# Patient Record
Sex: Female | Born: 1950
Health system: Southern US, Community
[De-identification: ages and names within clinical notes are randomized; demographics above are authoritative.]

## PROBLEM LIST (undated history)

## (undated) DIAGNOSIS — Z8489 Family history of other specified conditions: Secondary | ICD-10-CM

## (undated) HISTORY — PX: APPENDECTOMY: SHX54

---

## 1974-01-12 DIAGNOSIS — Z8489 Family history of other specified conditions: Secondary | ICD-10-CM

## 1974-01-12 HISTORY — DX: Family history of other specified conditions: Z84.89

## 2012-10-21 ENCOUNTER — Encounter: Payer: Self-pay | Admitting: Obstetrics and Gynecology

## 2012-10-21 ENCOUNTER — Ambulatory Visit (INDEPENDENT_AMBULATORY_CARE_PROVIDER_SITE_OTHER): Payer: PRIVATE HEALTH INSURANCE | Admitting: Obstetrics and Gynecology

## 2012-10-21 VITALS — BP 100/60 | HR 80 | Ht 67.5 in | Wt 177.0 lb

## 2012-10-21 DIAGNOSIS — E559 Vitamin D deficiency, unspecified: Secondary | ICD-10-CM

## 2012-10-21 DIAGNOSIS — Z0000 Encounter for general adult medical examination without abnormal findings: Secondary | ICD-10-CM

## 2012-10-21 DIAGNOSIS — Z01419 Encounter for gynecological examination (general) (routine) without abnormal findings: Secondary | ICD-10-CM

## 2012-10-21 LAB — CBC
HCT: 41.1 % (ref 36.0–46.0)
Hemoglobin: 14.2 g/dL (ref 12.0–15.0)
MCHC: 34.5 g/dL (ref 30.0–36.0)
Platelets: 278 10*3/uL (ref 150–400)
RDW: 12.8 % (ref 11.5–15.5)
WBC: 11.4 10*3/uL — ABNORMAL HIGH (ref 4.0–10.5)

## 2012-10-21 LAB — POCT URINALYSIS DIPSTICK
Bilirubin, UA: NEGATIVE
Leukocytes, UA: NEGATIVE
Protein, UA: 5
pH, UA: 5

## 2012-10-21 NOTE — Patient Instructions (Signed)

## 2012-10-21 NOTE — Progress Notes (Signed)
Patient ID: Casey Villarreal, female   DOB: 09-23-1950, 62 y.o.   MRN: [id number] GYNECOLOGY VISIT  PCP:  Joyce Copa, MD [place], [place])  Referring provider:   HPI: 62 y.o.   Married  Caucasian  female   G2P2002 with Patient's last menstrual period was 01/12/2001.   here for   AEX. Never did any hormone therapy.  Hgb:   Urine:  Neg  GYNECOLOGIC HISTORY: Patient's last menstrual period was 01/12/2001. Sexually active:  yes Partner preference: female Contraception:   postmenopausal Menopausal hormone therapy: no DES exposure:  no  Blood transfusions:  no  Sexually transmitted diseases:   no GYN Procedures:  none Mammogram:   2011 WUJ:[id number], [place]              Pap: 2011 wnl  History of abnormal pap smear:  no   OB History   Grav Para Term Preterm Abortions TAB SAB Ect Mult Living   2 2 2       2        LIFESTYLE: Exercise:    no         Tobacco:    no Alcohol:       2 glasses of wine per day Drug use:    no  OTHER HEALTH MAINTENANCE: Tetanus/TDap:   Unsure ?5 years ago Gardisil:  NA Influenza:   09/2011 Zostavax:   2011  Bone density:  2012 ? Osteopenia:[place], [place] Colonoscopy:  2005 [id number], --next due 2015  Cholesterol check:  2013 borderline with PCP  Family History  Problem Relation Age of Onset  . Stroke Mother   . Breast cancer Maternal Aunt   . Ovarian cancer Paternal Grandmother     There are no active problems to display for this patient.  History reviewed. No pertinent past medical history.  Past Surgical History  Procedure Laterality Date  . Cesarean section  1986, 1989  . Appendectomy      ALLERGIES: Review of patient's allergies indicates no known allergies.  No current outpatient prescriptions on file.   No current facility-administered medications for this visit.     ROS:  Pertinent items are noted in HPI.  SOCIAL HISTORY:  Sales rep.  PHYSICAL EXAMINATION:    BP 100/60  Pulse 80  Ht 5' 7.5" (1.715 m)  Wt 177 lb  (80.287 kg)  BMI 27.3 kg/m2  LMP 01/12/2001   Wt Readings from Last 3 Encounters:  10/21/12 177 lb (80.287 kg)     Ht Readings from Last 3 Encounters:  10/21/12 5' 7.5" (1.715 m)    General appearance: alert, cooperative and appears stated age Head: Normocephalic, without obvious abnormality, atraumatic Neck: no adenopathy, supple, symmetrical, trachea midline and thyroid not enlarged, symmetric, no tenderness/mass/nodules Lungs: clear to auscultation bilaterally Breasts: Inspection negative, No nipple retraction or dimpling, No nipple discharge or bleeding, No axillary or supraclavicular adenopathy, Normal to palpation without dominant masses Heart: regular rate and rhythm Abdomen: soft, non-tender; no masses,  no organomegaly Extremities: extremities normal, atraumatic, no cyanosis or edema Skin: Skin color, texture, turgor normal. No rashes or lesions Lymph nodes: Cervical, supraclavicular, and axillary nodes normal. No abnormal inguinal nodes palpated Neurologic: Grossly normal  Pelvic: External genitalia:  no lesions              Urethra:  normal appearing urethra with no masses, tenderness or lesions              Bartholins and Skenes: normal  Vagina: normal appearing vagina with normal color and discharge, no lesions              Cervix: normal appearance              Pap and high risk HPV testing done: yes.            Bimanual Exam:  Uterus:  uterus is normal size, shape, consistency and nontender                                      Adnexa: normal adnexa in size, nontender and no masses                                      Rectovaginal: Confirms                                      Anus:  normal sphincter tone, no lesions  ASSESSMENT  Normal gynecologic exam. Osteopenia Vit D deficiency  PLAN  [facility] order [facility]. Pap smear and high risk HPV testing Comprehensive metabolic panel, TSH, lipid profile, CBC, vit  D. Return annually or prn   An After Visit Summary was printed and given to the patient.

## 2012-10-22 LAB — COMPREHENSIVE METABOLIC PANEL
ALT: 10 U/L (ref 0–35)
AST: 13 U/L (ref 0–37)
Albumin: 4.2 g/dL (ref 3.5–5.2)
Alkaline Phosphatase: 59 U/L (ref 39–117)
BUN: 17 mg/dL (ref 6–23)
Chloride: 105 mEq/L (ref 96–112)
Glucose, Bld: 82 mg/dL (ref 70–99)
Potassium: 4.1 mEq/L (ref 3.5–5.3)
Sodium: 139 mEq/L (ref 135–145)
Total Protein: 6.9 g/dL (ref 6.0–8.3)

## 2012-10-22 LAB — LIPID PANEL
HDL: 76 mg/dL (ref >39)
LDL Cholesterol: 119 mg/dL — ABNORMAL HIGH (ref 0–99)
Total CHOL/HDL Ratio: 2.7 Ratio
VLDL: 9 mg/dL (ref 0–40)

## 2012-10-22 LAB — VITAMIN D 25 HYDROXY (VIT D DEFICIENCY, FRACTURES): Vit D, 25-Hydroxy: 51 ng/mL (ref 30–89)

## 2012-10-22 LAB — TSH: TSH: 1.302 u[IU]/mL (ref 0.350–4.500)

## 2012-10-24 ENCOUNTER — Encounter: Payer: Self-pay | Admitting: Obstetrics and Gynecology

## 2012-10-24 ENCOUNTER — Other Ambulatory Visit: Payer: Self-pay | Admitting: Obstetrics and Gynecology

## 2012-10-24 DIAGNOSIS — M899 Disorder of bone, unspecified: Secondary | ICD-10-CM

## 2012-10-24 DIAGNOSIS — E559 Vitamin D deficiency, unspecified: Secondary | ICD-10-CM

## 2012-11-17 ENCOUNTER — Other Ambulatory Visit: Payer: Self-pay

## 2012-12-23 ENCOUNTER — Ambulatory Visit
Admission: RE | Admit: 2012-12-23 | Discharge: 2012-12-23 | Disposition: A | Discharge status: Home / Self Care | Payer: PRIVATE HEALTH INSURANCE | Source: Ambulatory Visit | Attending: Obstetrics and Gynecology | Admitting: Obstetrics and Gynecology

## 2012-12-23 DIAGNOSIS — Z01419 Encounter for gynecological examination (general) (routine) without abnormal findings: Secondary | ICD-10-CM

## 2012-12-23 DIAGNOSIS — M899 Disorder of bone, unspecified: Secondary | ICD-10-CM

## 2012-12-23 DIAGNOSIS — E559 Vitamin D deficiency, unspecified: Secondary | ICD-10-CM

## 2013-01-24 ENCOUNTER — Telehealth: Payer: Self-pay | Admitting: *Deleted

## 2013-01-24 NOTE — Telephone Encounter (Signed)
Follow-up call to patient regarding Dr Elza Rafter recommendation for consult to discuss BMD results.  Patient states she has been very busy and has not yet had a chance to get previous reports from test done in [place].  She states she is really opposed to beginning any sort of medication.  Advised that Dr Quincy Simmonds is okay with that but wants to make sure she has all the necessary information to make an informed choice regarding her options. She will get records from [place] and call back to schedule. Advised to ask for sally if she has difficulty obtaining records or getting consult appointment.   Routing to provider for final review. Patient agreeable to disposition. Will close encounter

## 2013-09-11 ENCOUNTER — Encounter: Payer: Self-pay | Admitting: Obstetrics and Gynecology

## 2013-10-23 ENCOUNTER — Ambulatory Visit: Payer: PRIVATE HEALTH INSURANCE | Admitting: Obstetrics and Gynecology

## 2013-11-02 ENCOUNTER — Ambulatory Visit: Payer: PRIVATE HEALTH INSURANCE | Admitting: Obstetrics and Gynecology

## 2013-11-08 ENCOUNTER — Ambulatory Visit (INDEPENDENT_AMBULATORY_CARE_PROVIDER_SITE_OTHER): Payer: PRIVATE HEALTH INSURANCE | Admitting: Obstetrics and Gynecology

## 2013-11-08 ENCOUNTER — Encounter: Payer: Self-pay | Admitting: Obstetrics and Gynecology

## 2013-11-08 VITALS — BP 122/86 | HR 72 | Ht 67.5 in | Wt 167.0 lb

## 2013-11-08 DIAGNOSIS — Z01419 Encounter for gynecological examination (general) (routine) without abnormal findings: Secondary | ICD-10-CM

## 2013-11-08 DIAGNOSIS — Z0000 Encounter for general adult medical examination without abnormal findings: Secondary | ICD-10-CM

## 2013-11-08 LAB — POCT URINALYSIS DIPSTICK
BILIRUBIN UA: NEGATIVE
Blood, UA: NEGATIVE
Glucose, UA: NEGATIVE
KETONES UA: NEGATIVE
LEUKOCYTES UA: NEGATIVE
Nitrite, UA: NEGATIVE
PH UA: 5
Protein, UA: NEGATIVE
Urobilinogen, UA: NEGATIVE

## 2013-11-08 LAB — CBC
HEMATOCRIT: 39.8 % (ref 36.0–46.0)
Hemoglobin: 13.2 g/dL (ref 12.0–15.0)
MCH: 30.6 pg (ref 26.0–34.0)
MCHC: 33.2 g/dL (ref 30.0–36.0)
MCV: 92.1 fL (ref 78.0–100.0)
PLATELETS: 278 10*3/uL (ref 150–400)
RBC: 4.32 MIL/uL (ref 3.87–5.11)
RDW: 13.1 % (ref 11.5–15.5)
WBC: 4.4 10*3/uL (ref 4.0–10.5)

## 2013-11-08 LAB — HEMOGLOBIN, FINGERSTICK: Hemoglobin, fingerstick: 13.4 g/dL (ref 12.0–16.0)

## 2013-11-08 NOTE — Progress Notes (Signed)
63 y.o. [id number] MarriedCaucasianF here for annual exam.    Had a left ankle fracture when she was in her 85s.  Slipped on the grass.   Moving to [facility].  Needs to sell house.   Patient's last menstrual period was 01/12/2001.          Sexually active: Yes.    The current method of family planning is post menopausal status.    Exercising: Yes.    Home exercise routine includes walking 5 miles a day. Smoker:  no  Health Maintenance: Pap:  10/21/12 Neg HR HPV History of abnormal Pap:  no MMG:  01/06/13 Bi-Rads Neg Colonoscopy:  2005 wnl: [place], [place]--next due 2015.  Will go to [facility]. Is getting new insurance December 2015. BMD:   12/23/12  , osteopenia.  Hip - 0.8, spine - 1.2. Frax model indicating increased risk of fracture.  Declines Rx.  TDaP:  Unsure?  Thinks it is over 10 years.  Declines this today.  Screening Labs: today, pt request fasting labs  Hb today: 13.4, Urine today:  Neg, ph: 5.0   reports that she has never smoked. She does not have any smokeless tobacco history on file. She reports that she drinks about 7 ounces of alcohol per week. She reports that she does not use illicit drugs.  History reviewed. No pertinent past medical history.  Past Surgical History  Procedure Laterality Date  . Cesarean section  1986, 1989  . Appendectomy      No current outpatient prescriptions on file.   No current facility-administered medications for this visit.    Family History  Problem Relation Age of Onset  . Stroke Mother   . Breast cancer Maternal Aunt   . Ovarian cancer Paternal Grandmother     ROS:  Pertinent items are noted in HPI.  Otherwise, a comprehensive ROS was negative.  Exam:   BP 122/86  Pulse 72  Ht 5' 7.5" (1.715 m)  Wt 167 lb (75.751 kg)  BMI 25.75 kg/m2  LMP 01/12/2001    Height: 5' 7.5" (171.5 cm)  Ht Readings from Last 3 Encounters:  11/08/13 5' 7.5" (1.715 m)  10/21/12 5' 7.5" (1.715 m)    General appearance: alert,  cooperative and appears stated age Head: Normocephalic, without obvious abnormality, atraumatic Neck: no adenopathy, supple, symmetrical, trachea midline and thyroid normal to inspection and palpation Lungs: clear to auscultation bilaterally Breasts: normal appearance, no masses or tenderness, Inspection negative, No nipple retraction or dimpling, No nipple discharge or bleeding, No axillary or supraclavicular adenopathy Heart: regular rate and rhythm Abdomen: soft, non-tender; bowel sounds normal; no masses,  no organomegaly Extremities: extremities normal, atraumatic, no cyanosis or edema Skin: Skin color, texture, turgor normal. No rashes or lesions Lymph nodes: Cervical, supraclavicular, and axillary nodes normal. No abnormal inguinal nodes palpated Neurologic: Grossly normal   Pelvic: External genitalia:  no lesions              Urethra:  normal appearing urethra with no masses, tenderness or lesions              Bartholins and Skenes: normal                 Vagina: normal appearing vagina with normal color and discharge, no lesions              Cervix: no lesions              Pap taken: No. Bimanual Exam:  Uterus:  normal size, contour, position, consistency, mobility, non-tender              Adnexa: normal adnexa and no mass, fullness, tenderness               Rectovaginal: Confirms               Anus:  normal sphincter tone, no lesions  A:  Well Woman with normal exam Osteopenia.   P:   Mammogram in December.  pap smear not indicated.  Cholesterol, CMP, CBC. Weight bearing exercise, normal calcium and vit D in diet, recheck BMD in 2 years.  Declines TDap.  return annually or prn  An After Visit Summary was printed and given to the patient.

## 2013-11-08 NOTE — Patient Instructions (Signed)

## 2013-11-09 LAB — LIPID PANEL
Cholesterol: 232 mg/dL — ABNORMAL HIGH (ref 0–200)
HDL: 82 mg/dL (ref >39)
LDL Cholesterol: 141 mg/dL — ABNORMAL HIGH (ref 0–99)
Total CHOL/HDL Ratio: 2.8 ratio
Triglycerides: 47 mg/dL (ref <150)
VLDL: 9 mg/dL (ref 0–40)

## 2013-11-09 LAB — COMPREHENSIVE METABOLIC PANEL
ALK PHOS: 71 U/L (ref 39–117)
ALT: 11 U/L (ref 0–35)
AST: 15 U/L (ref 0–37)
Albumin: 4.2 g/dL (ref 3.5–5.2)
BUN: 17 mg/dL (ref 6–23)
CO2: 23 mEq/L (ref 19–32)
CREATININE: 0.64 mg/dL (ref 0.50–1.10)
Calcium: 8.6 mg/dL (ref 8.4–10.5)
Chloride: 104 mEq/L (ref 96–112)
Glucose, Bld: 93 mg/dL (ref 70–99)
POTASSIUM: 4.1 meq/L (ref 3.5–5.3)
Sodium: 139 mEq/L (ref 135–145)
Total Bilirubin: 0.7 mg/dL (ref 0.2–1.2)
Total Protein: 6.8 g/dL (ref 6.0–8.3)

## 2013-11-13 ENCOUNTER — Encounter: Payer: Self-pay | Admitting: Obstetrics and Gynecology

## 2014-02-02 ENCOUNTER — Ambulatory Visit: Payer: PRIVATE HEALTH INSURANCE | Admitting: Obstetrics and Gynecology

## 2014-03-20 ENCOUNTER — Other Ambulatory Visit: Payer: Self-pay

## 2014-03-20 DIAGNOSIS — Z1231 Encounter for screening mammogram for malignant neoplasm of breast: Secondary | ICD-10-CM

## 2014-04-04 ENCOUNTER — Ambulatory Visit
Admission: RE | Admit: 2014-04-04 | Discharge: 2014-04-04 | Disposition: A | Discharge status: Home / Self Care | Payer: BLUE CROSS/BLUE SHIELD | Source: Ambulatory Visit

## 2014-04-04 DIAGNOSIS — Z1231 Encounter for screening mammogram for malignant neoplasm of breast: Secondary | ICD-10-CM

## 2014-11-12 ENCOUNTER — Ambulatory Visit (INDEPENDENT_AMBULATORY_CARE_PROVIDER_SITE_OTHER): Payer: BLUE CROSS/BLUE SHIELD | Admitting: Obstetrics and Gynecology

## 2014-11-12 ENCOUNTER — Encounter: Payer: Self-pay | Admitting: Obstetrics and Gynecology

## 2014-11-12 VITALS — BP 108/76 | HR 60 | Resp 14 | Ht 68.0 in | Wt 155.0 lb

## 2014-11-12 DIAGNOSIS — Z1211 Encounter for screening for malignant neoplasm of colon: Secondary | ICD-10-CM

## 2014-11-12 DIAGNOSIS — Z0000 Encounter for general adult medical examination without abnormal findings: Secondary | ICD-10-CM | POA: Diagnosis not present

## 2014-11-12 DIAGNOSIS — Z01419 Encounter for gynecological examination (general) (routine) without abnormal findings: Secondary | ICD-10-CM | POA: Diagnosis not present

## 2014-11-12 LAB — POCT URINALYSIS DIPSTICK
BILIRUBIN UA: NEGATIVE
GLUCOSE UA: NEGATIVE
LEUKOCYTES UA: NEGATIVE
NITRITE UA: NEGATIVE
Protein, UA: NEGATIVE
RBC UA: 5
UROBILINOGEN UA: NEGATIVE

## 2014-11-12 LAB — COMPREHENSIVE METABOLIC PANEL
ALT: 11 U/L (ref 6–29)
AST: 15 U/L (ref 10–35)
Albumin: 4.3 g/dL (ref 3.6–5.1)
Alkaline Phosphatase: 61 U/L (ref 33–130)
BUN: 14 mg/dL (ref 7–25)
CALCIUM: 9.2 mg/dL (ref 8.6–10.4)
CHLORIDE: 102 mmol/L (ref 98–110)
CO2: 29 mmol/L (ref 20–31)
CREATININE: 0.62 mg/dL (ref 0.50–0.99)
GLUCOSE: 76 mg/dL (ref 65–99)
POTASSIUM: 4.3 mmol/L (ref 3.5–5.3)
Sodium: 139 mmol/L (ref 135–146)
Total Bilirubin: 0.6 mg/dL (ref 0.2–1.2)
Total Protein: 7 g/dL (ref 6.1–8.1)

## 2014-11-12 LAB — LIPID PANEL
CHOLESTEROL: 230 mg/dL — AB (ref 125–200)
HDL: 79 mg/dL (ref >=46)
LDL Cholesterol: 143 mg/dL — ABNORMAL HIGH (ref <130)
TRIGLYCERIDES: 39 mg/dL (ref <150)
Total CHOL/HDL Ratio: 2.9 Ratio (ref <=5.0)
VLDL: 8 mg/dL (ref <30)

## 2014-11-12 LAB — CBC
HEMATOCRIT: 41.2 % (ref 36.0–46.0)
Hemoglobin: 14 g/dL (ref 12.0–15.0)
MCH: 30.3 pg (ref 26.0–34.0)
MCHC: 34 g/dL (ref 30.0–36.0)
MCV: 89.2 fL (ref 78.0–100.0)
MPV: 9.8 fL (ref 8.6–12.4)
Platelets: 349 10*3/uL (ref 150–400)
RBC: 4.62 MIL/uL (ref 3.87–5.11)
RDW: 12.8 % (ref 11.5–15.5)
WBC: 4.2 10*3/uL (ref 4.0–10.5)

## 2014-11-12 LAB — HEMOGLOBIN, FINGERSTICK: HEMOGLOBIN, FINGERSTICK: 13.8 g/dL (ref 12.0–16.0)

## 2014-11-12 NOTE — Patient Instructions (Signed)

## 2014-11-12 NOTE — Progress Notes (Signed)
64 y.o. G59P2002 Married Caucasian female here for annual exam.    Moving to [place] once they sell their home in [place].  Has some increased chin hair growth.  Plucks to remove.  No other hair growth.   PCP:   [place]   Patient's last menstrual period was 01/12/2001.          Sexually active: Yes.    The current method of family planning is post menopausal status.    Exercising: Yes.    walking 5 miles a day Smoker:  no  Health Maintenance: Pap:  10/21/12 Neg. HR HPV:neg History of abnormal Pap:  no MMG:  04/04/14 BIRADS1:neg Colonoscopy: 2005 normal in [place] - repeat 10 years. Pt knows overdue BMD:   12/2012   Result  Osteopenia  TDaP:  Due. Declines due to side effects.  Screening Labs:  Hb today: 13.8, Urine today: Ketones=trace   reports that she has never smoked. She has never used smokeless tobacco. She reports that she drinks about 7.0 oz of alcohol per week. She reports that she does not use illicit drugs.  History reviewed. No pertinent past medical history.  Past Surgical History  Procedure Laterality Date  . Cesarean section  1986, 1989  . Appendectomy      Current Outpatient Prescriptions  Medication Sig Dispense Refill  . Multiple Vitamin (MULTIVITAMIN) tablet Take 1 tablet by mouth daily.     No current facility-administered medications for this visit.    Family History  Problem Relation Age of Onset  . Stroke Mother   . Breast cancer Maternal Aunt   . Ovarian cancer Paternal Grandmother     ROS:  Pertinent items are noted in HPI.  Otherwise, a comprehensive ROS was negative.  Exam:   BP 108/76 mmHg  Pulse 60  Resp 14  Ht 5\' 8"  (1.727 m)  Wt 155 lb (70.308 kg)  BMI 23.57 kg/m2  LMP 01/12/2001    General appearance: alert, cooperative and appears stated age Head: Normocephalic, without obvious abnormality, atraumatic Neck: no adenopathy, supple, symmetrical, trachea midline and thyroid normal to inspection and palpation Lungs:  clear to auscultation bilaterally Breasts: normal appearance, no masses or tenderness, Inspection negative, No nipple retraction or dimpling, No nipple discharge or bleeding, No axillary or supraclavicular adenopathy Heart: regular rate and rhythm Abdomen: soft, non-tender; bowel sounds normal; no masses,  no organomegaly Extremities: extremities normal, atraumatic, no cyanosis or edema Skin: Skin color, texture, turgor normal. No rashes or lesions Lymph nodes: Cervical, supraclavicular, and axillary nodes normal. No abnormal inguinal nodes palpated Neurologic: Grossly normal  Pelvic: External genitalia:  no lesions              Urethra:  normal appearing urethra with no masses, tenderness or lesions              Bartholins and Skenes: normal                 Vagina: normal appearing vagina with normal color and discharge, no lesions              Cervix: no lesions              Pap taken: No. Bimanual Exam:  Uterus:  normal size, contour, position, consistency, mobility, non-tender              Adnexa: normal adnexa and no mass, fullness, tenderness              Rectovaginal: Yes.  .  Confirms.  Anus:  normal sphincter tone, no lesions  Chaperone was present for exam.  Assessment:   Well woman visit with normal exam. Osteopenia.   Plan: Yearly mammogram recommended after age 50.  Recommended self breast exam.  Pap and HR HPV as above. Discussed Calcium, Vitamin D, regular exercise program including cardiovascular and weight bearing exercise. Labs performed.  Yes.  .   See orders. Refills given on medications.  No..    Discussed electrolysis or laser hair removal through a dermatology office of choice.  Will refer to [place] for screening colonoscopy.  Bone density next year.  Follow up annually and prn.    After visit summary provided.

## 2015-06-12 ENCOUNTER — Other Ambulatory Visit: Payer: Self-pay

## 2015-06-12 DIAGNOSIS — Z1231 Encounter for screening mammogram for malignant neoplasm of breast: Secondary | ICD-10-CM

## 2015-06-26 ENCOUNTER — Ambulatory Visit
Admission: RE | Admit: 2015-06-26 | Discharge: 2015-06-26 | Disposition: A | Discharge status: Home / Self Care | Payer: BLUE CROSS/BLUE SHIELD | Source: Ambulatory Visit

## 2015-06-26 DIAGNOSIS — Z1231 Encounter for screening mammogram for malignant neoplasm of breast: Secondary | ICD-10-CM

## 2015-08-28 ENCOUNTER — Encounter: Payer: Self-pay | Admitting: Gastroenterology

## 2015-11-06 ENCOUNTER — Encounter: Payer: Self-pay | Admitting: Gastroenterology

## 2015-11-06 ENCOUNTER — Ambulatory Visit (INDEPENDENT_AMBULATORY_CARE_PROVIDER_SITE_OTHER): Payer: BLUE CROSS/BLUE SHIELD | Admitting: Gastroenterology

## 2015-11-06 VITALS — BP 112/72 | HR 74 | Ht 67.75 in | Wt 159.2 lb

## 2015-11-06 DIAGNOSIS — Z1211 Encounter for screening for malignant neoplasm of colon: Secondary | ICD-10-CM

## 2015-11-06 NOTE — Patient Instructions (Signed)
If you are age 63 or older, your body mass index should be between 23-30. Your Body mass index is 24.39 kg/m. If this is out of the aforementioned range listed, please consider follow up with your Primary Care Provider.  If you are age 58 or younger, your body mass index should be between 19-25. Your Body mass index is 24.39 kg/m. If this is out of the aformentioned range listed, please consider follow up with your Primary Care Provider.   We have sent your demographic and insurance information to [employer]. They should contact you within the next week regarding your Cologuard (colon cancer screening) test. If you have not heard from them within the next week, please call our office at [phone].

## 2015-11-06 NOTE — Progress Notes (Signed)
HPI :  65 y/o female with no significant medical history here to discuss colon cancer screening.  Her last colonoscopy was 11 years ago, done in [place]. No records available but she reports no polyps were removed, she was told to follow up in 10 years for further screening. She denies any trouble with her bowels, no blood in the stools. No diarrhea or constipation. No abdominal pains or weight loss. Aunt colon cancer, no other family members with colon cancer. She denies any history of anemia. She states she has seen Cologuard commercials on TV and wants to avoid a colonoscopy if possible.   No past medical history on file. Otherwise healthy  Past Surgical History:  Procedure Laterality Date  . APPENDECTOMY    . CESAREAN SECTION  1986, 1989     Family History  Problem Relation Age of Onset  . Stroke Mother   . Breast cancer Maternal Aunt   . Ovarian cancer Paternal Grandmother   mother leukemia  Social History  Substance Use Topics  . Smoking status: Never Smoker  . Smokeless tobacco: Never Used  . Alcohol use 7.0 oz/week    14 Standard drinks or equivalent per week     Comment: 2 glasses of wine per day   Current Outpatient Prescriptions  Medication Sig Dispense Refill  . Multiple Vitamin (MULTIVITAMIN) tablet Take 1 tablet by mouth daily.     No current facility-administered medications for this visit.    No Known Allergies   Review of Systems: All systems reviewed and negative except where noted in HPI.    Lab Results  Component Value Date   WBC 4.2 11/12/2014   HGB 13.8 11/12/2014   HGB 14.0 11/12/2014   HCT 41.2 11/12/2014   MCV 89.2 11/12/2014   PLT 349 11/12/2014    Lab Results  Component Value Date   CREATININE 0.62 11/12/2014   BUN 14 11/12/2014   NA 139 11/12/2014   K 4.3 11/12/2014   CL 102 11/12/2014   CO2 29 11/12/2014    Lab Results  Component Value Date   ALT 11 11/12/2014   AST 15 11/12/2014   ALKPHOS 61 11/12/2014   BILITOT  0.6 11/12/2014     Physical Exam: BP 112/72   Pulse 74   Ht 5' 7.75" (1.721 m)   Wt 159 lb 3.2 oz (72.2 kg)   LMP 01/12/2001   BMI 24.39 kg/m  Constitutional: Pleasant,well-developed, female in no acute distress. HEENT: Normocephalic and atraumatic. Conjunctivae are normal. No scleral icterus. Neck supple.  Cardiovascular: Normal rate, regular rhythm.  Pulmonary/chest: Effort normal and breath sounds normal. No wheezing, rales or rhonchi. Abdominal: Soft, nondistended, nontender. There are no masses palpable. No hepatomegaly. Extremities: no edema Lymphadenopathy: No cervical adenopathy noted. Neurological: Alert and oriented to person place and time. Skin: Skin is warm and dry. No rashes noted. Psychiatric: Normal mood and affect. Behavior is normal.   ASSESSMENT AND PLAN: 65 y/o healthy female as outlined above, at average risk for colon cancer, presenting for colon cancer screening. I discussed options for screening with her to include optical colonoscopy and stool testing, including risks / benefits of each, recommending optical colonoscopy at first line screening modality per triservice guidelines. Following our discussion she wanted to avoid a colonoscopy if at all possible. After discussion of stool testing, she wished to proceed with Cologuard over FIT testing, in hopes that if a negative test she will not need screening for 3 years. If it is positive, she  will need to proceed with optical colonoscopy. She agreed with the plan, will await results of Cologuard.  Williams Cellar, MD [facility] Pager [phone]

## 2015-11-07 ENCOUNTER — Telehealth: Payer: Self-pay

## 2015-11-08 NOTE — Telephone Encounter (Signed)
Received fax from [employer]. They requested husbands DOB since his is the Agricultural engineer. DOB entered on form and faxed back to [employer]

## 2015-11-13 NOTE — Progress Notes (Signed)
65 y.o. G62P2002 Married Caucasian female here for annual exam.    Trying to sell their home and move to [place].  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 Fasted today for labs.   PCP:  None   Patient's last menstrual period was 01/12/2001.           Sexually active: Yes.  female  The current method of family planning is post menopausal status.    Exercising: Yes.    Walks 30 miles/week Smoker:  no  Health Maintenance: Pap:  10-21-12 Neg:Neg HR HPV History of abnormal Pap:  no MMG:  06-26-15 Density B/Neg/BiRads1:[facility] Colonoscopy: ??2005 normal in [place] - repeat 10 years. Pt knows overdue  BMD:  12/2012  Result: [place] TDaP:  Declines--due to side effects Gardasil:   N/A HIV:  Declines. Hep C:  Today.  Screening Labs:  Urine today: Neg   reports that she has never smoked. She has never used smokeless tobacco. She reports that she drinks about 3.6 oz of alcohol per week . She reports that she does not use drugs.  History reviewed. No pertinent past medical history.  Past Surgical History:  Procedure Laterality Date  . APPENDECTOMY    . CESAREAN SECTION  1986, 1989    Current Outpatient Prescriptions  Medication Sig Dispense Refill  . Multiple Vitamin (MULTIVITAMIN) tablet Take 1 tablet by mouth daily.     No current facility-administered medications for this visit.     Family History  Problem Relation Age of Onset  . Stroke Mother   . Depression Brother   . Heart disease Brother   . Breast cancer Maternal Aunt   . Ovarian cancer Paternal Grandmother     ROS:  Pertinent items are noted in HPI.  Otherwise, a comprehensive ROS was negative.  Exam:   BP 110/64 (BP Location: Right Arm, Patient Position: Sitting, Cuff Size: Normal)   Pulse (!) 50   Resp 14   Ht 5' 7.5" (1.715 m)   Wt 154 lb (69.9 kg)   LMP 01/12/2001   BMI 23.76 kg/m     General appearance: alert, cooperative and appears stated age Head: Normocephalic, without  obvious abnormality, atraumatic Neck: no adenopathy, supple, symmetrical, trachea midline and thyroid normal to inspection and palpation Lungs: clear to auscultation bilaterally Breasts: normal appearance, no masses or tenderness, No nipple retraction or dimpling, No nipple discharge or bleeding, No axillary or supraclavicular adenopathy Heart: regular rate and rhythm Abdomen: soft, non-tender; no masses, no organomegaly Extremities: extremities normal, atraumatic, no cyanosis or edema Skin: Skin color, texture, turgor normal. No rashes or lesions Lymph nodes: Cervical, supraclavicular, and axillary nodes normal. No abnormal inguinal nodes palpated Neurologic: Grossly normal  Pelvic: External genitalia:  no lesions              Urethra:  normal appearing urethra with no masses, tenderness or lesions              Bartholins and Skenes: normal                 Vagina: normal appearing vagina with normal color and discharge, no lesions              Cervix: no lesions              Pap taken: Yes.   Bimanual Exam:  Uterus:  normal size, contour, position, consistency, mobility, non-tender              Adnexa:  no mass, fullness, tenderness              Rectal exam: Yes.  .  Confirms.              Anus:  normal sphincter tone, no lesions  Chaperone was present for exam.  Assessment:   Well woman visit with normal exam. Osteopenia.  Plan: Yearly mammogram recommended after age 22.  Recommended self breast exam.  Pap and HR HPV as above. Discussed Calcium, Vitamin D, regular exercise program including cardiovascular and weight bearing exercise. [facility]. She will call to schedule.  Routine labs and hep C. Follow up annually and prn.       After visit summary provided.

## 2015-11-15 ENCOUNTER — Ambulatory Visit (INDEPENDENT_AMBULATORY_CARE_PROVIDER_SITE_OTHER): Payer: BLUE CROSS/BLUE SHIELD | Admitting: Obstetrics and Gynecology

## 2015-11-15 ENCOUNTER — Encounter: Payer: Self-pay | Admitting: Obstetrics and Gynecology

## 2015-11-15 VITALS — BP 110/64 | HR 50 | Resp 14 | Ht 67.5 in | Wt 154.0 lb

## 2015-11-15 DIAGNOSIS — M8588 Other specified disorders of bone density and structure, other site: Secondary | ICD-10-CM | POA: Diagnosis not present

## 2015-11-15 DIAGNOSIS — D72819 Decreased white blood cell count, unspecified: Secondary | ICD-10-CM

## 2015-11-15 DIAGNOSIS — Z0000 Encounter for general adult medical examination without abnormal findings: Secondary | ICD-10-CM | POA: Diagnosis not present

## 2015-11-15 DIAGNOSIS — Z119 Encounter for screening for infectious and parasitic diseases, unspecified: Secondary | ICD-10-CM

## 2015-11-15 DIAGNOSIS — Z01419 Encounter for gynecological examination (general) (routine) without abnormal findings: Secondary | ICD-10-CM

## 2015-11-15 LAB — CBC
HCT: 40.3 % (ref 35.0–45.0)
Hemoglobin: 13.3 g/dL (ref 11.7–15.5)
MCH: 30.5 pg (ref 27.0–33.0)
MCHC: 33 g/dL (ref 32.0–36.0)
MCV: 92.4 fL (ref 80.0–100.0)
MPV: 9.6 fL (ref 7.5–12.5)
Platelets: 291 10*3/uL (ref 140–400)
RBC: 4.36 MIL/uL (ref 3.80–5.10)
RDW: 12.9 % (ref 11.0–15.0)
WBC: 3.6 10*3/uL — ABNORMAL LOW (ref 3.8–10.8)

## 2015-11-15 LAB — TSH: TSH: 1.89 m[IU]/L

## 2015-11-15 LAB — POCT URINALYSIS DIPSTICK
Bilirubin, UA: NEGATIVE
Blood, UA: NEGATIVE
GLUCOSE UA: NEGATIVE
Ketones, UA: NEGATIVE
Leukocytes, UA: NEGATIVE
Nitrite, UA: NEGATIVE
Protein, UA: NEGATIVE
Urobilinogen, UA: NEGATIVE
pH, UA: 5

## 2015-11-15 NOTE — Patient Instructions (Signed)

## 2015-11-16 LAB — COMPREHENSIVE METABOLIC PANEL
ALT: 11 U/L (ref 6–29)
AST: 18 U/L (ref 10–35)
Albumin: 4.1 g/dL (ref 3.6–5.1)
Alkaline Phosphatase: 55 U/L (ref 33–130)
BUN: 11 mg/dL (ref 7–25)
CHLORIDE: 103 mmol/L (ref 98–110)
CO2: 24 mmol/L (ref 20–31)
CREATININE: 0.72 mg/dL (ref 0.50–0.99)
Calcium: 9.3 mg/dL (ref 8.6–10.4)
Glucose, Bld: 80 mg/dL (ref 65–99)
Potassium: 4.5 mmol/L (ref 3.5–5.3)
SODIUM: 139 mmol/L (ref 135–146)
Total Bilirubin: 0.7 mg/dL (ref 0.2–1.2)
Total Protein: 7 g/dL (ref 6.1–8.1)

## 2015-11-16 LAB — LIPID PANEL
Cholesterol: 210 mg/dL — ABNORMAL HIGH (ref 125–200)
HDL: 85 mg/dL (ref >=46)
LDL Cholesterol: 116 mg/dL (ref <130)
Total CHOL/HDL Ratio: 2.5 Ratio (ref <=5.0)
Triglycerides: 46 mg/dL (ref <150)
VLDL: 9 mg/dL (ref <30)

## 2015-11-16 LAB — HEPATITIS C ANTIBODY: HCV Ab: NEGATIVE

## 2015-11-16 LAB — VITAMIN D 25 HYDROXY (VIT D DEFICIENCY, FRACTURES): VIT D 25 HYDROXY: 33 ng/mL (ref 30–100)

## 2015-11-17 ENCOUNTER — Encounter: Payer: Self-pay | Admitting: Obstetrics and Gynecology

## 2015-11-17 NOTE — Addendum Note (Signed)
Addended by: Yisroel Ramming, Dietrich Pates E on: 11/17/2015 07:33 PM   Modules accepted: Orders

## 2015-11-20 LAB — IPS PAP TEST WITH HPV

## 2015-11-25 ENCOUNTER — Other Ambulatory Visit: Payer: Self-pay

## 2015-11-25 LAB — COLOGUARD: COLOGUARD: NEGATIVE

## 2015-11-26 ENCOUNTER — Encounter: Payer: Self-pay | Admitting: Gastroenterology

## 2015-11-27 ENCOUNTER — Ambulatory Visit: Payer: BLUE CROSS/BLUE SHIELD | Admitting: Obstetrics and Gynecology

## 2015-11-27 NOTE — Progress Notes (Signed)
Letter mailed

## 2016-01-27 ENCOUNTER — Other Ambulatory Visit (INDEPENDENT_AMBULATORY_CARE_PROVIDER_SITE_OTHER): Payer: BLUE CROSS/BLUE SHIELD

## 2016-01-27 DIAGNOSIS — D72819 Decreased white blood cell count, unspecified: Secondary | ICD-10-CM

## 2016-01-28 LAB — CBC WITH DIFFERENTIAL/PLATELET
BASOS ABS: 120 {cells}/uL (ref 0–200)
BASOS PCT: 3 %
Eosinophils Absolute: 160 cells/uL (ref 15–500)
Eosinophils Relative: 4 %
HCT: 38.9 % (ref 35.0–45.0)
HEMOGLOBIN: 12.5 g/dL (ref 11.7–15.5)
LYMPHS ABS: 1160 {cells}/uL (ref 850–3900)
Lymphocytes Relative: 29 %
MCH: 30.7 pg (ref 27.0–33.0)
MCHC: 32.1 g/dL (ref 32.0–36.0)
MCV: 95.6 fL (ref 80.0–100.0)
MPV: 9.8 fL (ref 7.5–12.5)
Monocytes Absolute: 480 cells/uL (ref 200–950)
Monocytes Relative: 12 %
Neutro Abs: 2080 cells/uL (ref 1500–7800)
Neutrophils Relative %: 52 %
PLATELETS: 360 10*3/uL (ref 140–400)
RBC: 4.07 MIL/uL (ref 3.80–5.10)
RDW: 13.5 % (ref 11.0–15.0)
WBC: 4 10*3/uL (ref 3.8–10.8)

## 2016-11-05 ENCOUNTER — Other Ambulatory Visit: Payer: Self-pay | Admitting: Obstetrics and Gynecology

## 2016-11-05 DIAGNOSIS — Z1231 Encounter for screening mammogram for malignant neoplasm of breast: Secondary | ICD-10-CM

## 2016-11-16 NOTE — Progress Notes (Signed)
66 y.o. G36P2002 Married Caucasian female here for annual exam.    Sold their home and moved to [place].   Lost 15 pounds of weight.  [facility] [employer]   Wants to do routine labs today.  PCP: No PCP per patient  Patient's last menstrual period was 01/12/2001.           Sexually active: Yes.    The current method of family planning is post menopausal status.    Exercising: Yes.    cardio, weight bearing Smoker:  no  Health Maintenance: Pap: 11/15/15 Neg: Neg HR HPV  10-21-12 Neg:Neg HR HPV  History of abnormal Pap:  no MMG: 06-26-15 Density B/Neg/BiRads1:TBC Colonoscopy: patient did the Cologuard in 2018.  Negative. BMD: 12/2012  Result :Osteopenia:TBC TDaP:  Declines--due to side effects Gardasil:   no HIV: never Hep C:11-15-15 Neg Screening Labs:  Discuss today   reports that  has never smoked. she has never used smokeless tobacco. She reports that she drinks about 3.6 oz of alcohol per week. She reports that she does not use drugs.  History reviewed. No pertinent past medical history.  Past Surgical History:  Procedure Laterality Date  . APPENDECTOMY    . CESAREAN SECTION  1986, 1989    Current Outpatient Medications  Medication Sig Dispense Refill  . Multiple Vitamin (MULTIVITAMIN) tablet Take 1 tablet by mouth daily.     No current facility-administered medications for this visit.     Family History  Problem Relation Age of Onset  . Stroke Mother   . Depression Brother   . Heart disease Brother   . Breast cancer Maternal Aunt   . Ovarian cancer Paternal Grandmother     ROS:  Pertinent items are noted in HPI.  Otherwise, a comprehensive ROS was negative.  Exam:   BP 110/62 (BP Location: Right Arm, Patient Position: Sitting, Cuff Size: Normal)   Pulse 76   Resp 16   Ht 5\' 8"  (1.727 m)   Wt 144 lb (65.3 kg)   LMP 01/12/2001   BMI 21.90 kg/m     General appearance: alert, cooperative and appears stated age Head: Normocephalic, without obvious abnormality,  atraumatic Neck: no adenopathy, supple, symmetrical, trachea midline and thyroid normal to inspection and palpation Lungs: clear to auscultation bilaterally Breasts: normal appearance, no masses or tenderness, No nipple retraction or dimpling, No nipple discharge or bleeding, No axillary or supraclavicular adenopathy Heart: regular rate and rhythm Abdomen: soft, non-tender; no masses, no organomegaly Extremities: extremities normal, atraumatic, no cyanosis or edema Skin: Skin color, texture, turgor normal. No rashes or lesions Lymph nodes: Cervical, supraclavicular, and axillary nodes normal. No abnormal inguinal nodes palpated Neurologic: Grossly normal  Pelvic: External genitalia:  no lesions              Urethra:  normal appearing urethra with no masses, tenderness or lesions              Bartholins and Skenes: normal                 Vagina: normal appearing vagina with normal color and discharge, no lesions              Cervix: no lesions              Pap taken: No. Bimanual Exam:  Uterus:  normal size, contour, position, consistency, mobility, non-tender              Adnexa: no mass, fullness, tenderness  Rectal exam: Yes.  .  Confirms.              Anus:  normal sphincter tone, no lesions  Chaperone was present for exam.  Assessment:   Well woman visit with normal exam. Osteopenia.   Plan: Mammogram screening discussed.  Has appointment.  Recommended self breast awareness. Pap and HR HPV as above. Guidelines for Calcium, Vitamin D, regular exercise program including cardiovascular and weight bearing exercise. Routine labs - cholesterol, CMP, CBC, vit D, and TSH. BMD ordered and patient will call to schedule. She will establish care with PCP.  Follow up annually and prn.   After visit summary provided.

## 2016-11-18 ENCOUNTER — Ambulatory Visit: Payer: BLUE CROSS/BLUE SHIELD | Admitting: Obstetrics and Gynecology

## 2016-11-18 ENCOUNTER — Other Ambulatory Visit: Payer: Self-pay

## 2016-11-18 ENCOUNTER — Encounter: Payer: Self-pay | Admitting: Obstetrics and Gynecology

## 2016-11-18 VITALS — BP 110/62 | HR 76 | Resp 16 | Ht 68.0 in | Wt 144.0 lb

## 2016-11-18 DIAGNOSIS — Z01419 Encounter for gynecological examination (general) (routine) without abnormal findings: Secondary | ICD-10-CM | POA: Diagnosis not present

## 2016-11-18 DIAGNOSIS — M8580 Other specified disorders of bone density and structure, unspecified site: Secondary | ICD-10-CM

## 2016-11-18 DIAGNOSIS — Z780 Asymptomatic menopausal state: Secondary | ICD-10-CM | POA: Diagnosis not present

## 2016-11-18 NOTE — Patient Instructions (Signed)

## 2016-11-19 LAB — CBC
HEMATOCRIT: 42.2 % (ref 34.0–46.6)
Hemoglobin: 13.8 g/dL (ref 11.1–15.9)
MCH: 31 pg (ref 26.6–33.0)
MCHC: 32.7 g/dL (ref 31.5–35.7)
MCV: 95 fL (ref 79–97)
Platelets: 331 10*3/uL (ref 150–379)
RBC: 4.45 x10E6/uL (ref 3.77–5.28)
RDW: 13.2 % (ref 12.3–15.4)
WBC: 4.2 10*3/uL (ref 3.4–10.8)

## 2016-11-19 LAB — LIPID PANEL
Chol/HDL Ratio: 2.7 ratio (ref 0.0–4.4)
Cholesterol, Total: 227 mg/dL — ABNORMAL HIGH (ref 100–199)
HDL: 83 mg/dL (ref >39)
LDL Calculated: 136 mg/dL — ABNORMAL HIGH (ref 0–99)
TRIGLYCERIDES: 40 mg/dL (ref 0–149)
VLDL CHOLESTEROL CAL: 8 mg/dL (ref 5–40)

## 2016-11-19 LAB — COMPREHENSIVE METABOLIC PANEL
A/G RATIO: 1.8 (ref 1.2–2.2)
ALK PHOS: 64 IU/L (ref 39–117)
ALT: 13 IU/L (ref 0–32)
AST: 17 IU/L (ref 0–40)
Albumin: 4.4 g/dL (ref 3.6–4.8)
BUN / CREAT RATIO: 20 (ref 12–28)
BUN: 14 mg/dL (ref 8–27)
Bilirubin Total: 0.4 mg/dL (ref 0.0–1.2)
CHLORIDE: 103 mmol/L (ref 96–106)
CO2: 25 mmol/L (ref 20–29)
Calcium: 9.1 mg/dL (ref 8.7–10.3)
Creatinine, Ser: 0.71 mg/dL (ref 0.57–1.00)
GFR calc Af Amer: 104 mL/min/{1.73_m2} (ref >59)
GFR calc non Af Amer: 90 mL/min/{1.73_m2} (ref >59)
GLOBULIN, TOTAL: 2.5 g/dL (ref 1.5–4.5)
Glucose: 89 mg/dL (ref 65–99)
Potassium: 4.4 mmol/L (ref 3.5–5.2)
SODIUM: 143 mmol/L (ref 134–144)
Total Protein: 6.9 g/dL (ref 6.0–8.5)

## 2016-11-19 LAB — TSH: TSH: 1.96 u[IU]/mL (ref 0.450–4.500)

## 2016-11-19 LAB — VITAMIN D 25 HYDROXY (VIT D DEFICIENCY, FRACTURES): Vit D, 25-Hydroxy: 32.5 ng/mL (ref 30.0–100.0)

## 2016-12-07 ENCOUNTER — Ambulatory Visit
Admission: RE | Admit: 2016-12-07 | Discharge: 2016-12-07 | Disposition: A | Discharge status: Home / Self Care | Payer: BLUE CROSS/BLUE SHIELD | Source: Ambulatory Visit | Attending: Obstetrics and Gynecology | Admitting: Obstetrics and Gynecology

## 2016-12-07 DIAGNOSIS — Z1231 Encounter for screening mammogram for malignant neoplasm of breast: Secondary | ICD-10-CM

## 2016-12-23 ENCOUNTER — Telehealth: Payer: Self-pay

## 2016-12-23 ENCOUNTER — Ambulatory Visit
Admission: RE | Admit: 2016-12-23 | Discharge: 2016-12-23 | Disposition: A | Discharge status: Home / Self Care | Payer: BLUE CROSS/BLUE SHIELD | Source: Ambulatory Visit | Attending: Obstetrics and Gynecology | Admitting: Obstetrics and Gynecology

## 2016-12-23 ENCOUNTER — Encounter: Payer: Self-pay | Admitting: Obstetrics and Gynecology

## 2016-12-23 DIAGNOSIS — M8580 Other specified disorders of bone density and structure, unspecified site: Secondary | ICD-10-CM

## 2016-12-23 DIAGNOSIS — Z780 Asymptomatic menopausal state: Secondary | ICD-10-CM

## 2016-12-23 NOTE — Telephone Encounter (Signed)
Spoke with Opal Sidles at [facility] and she will have Dr.Green review BMD and either call us or send and addendum. Routed to [place]

## 2016-12-23 NOTE — Telephone Encounter (Signed)
-----   Message from Casey Dom, MD sent at 12/23/2016  1:52 PM EST ----- Please check with radiology. The patient's FRAX risk was 23% of any fracture several years ago and now it is 15%. She has not been on medication. Thanks.

## 2016-12-24 NOTE — Telephone Encounter (Signed)
Dr. Talbert Nan - please review edited BMD and advise on results.     Non-Urgent Medical Question  Message [id number]  From Emeline Gins To Nunzio Cobbs, MD Sent 12/23/2016 5:33 PM  Hi Dr. Quincy Simmonds,  I don't understand the results of my bone density. Could you please put it in a language that I can understand.  Thanks,  Casey Villarreal    Cc: Marisa Sprinkles

## 2016-12-24 NOTE — Telephone Encounter (Signed)
Please let the patient know that we have a call in to the Radiologist to clarify her results. Once we here from them we will be back in touch with her.

## 2016-12-28 NOTE — Telephone Encounter (Signed)
See result note.  

## 2017-03-17 ENCOUNTER — Telehealth: Payer: Self-pay | Admitting: Obstetrics and Gynecology

## 2017-03-17 NOTE — Telephone Encounter (Signed)
Patient was treated for a yeast infection by her PCP and still has symptoms. Patient said she took a 1 day pill then a 7 day treatment twice. Patient would like to talk with Dr.Silva's nurse.

## 2017-03-17 NOTE — Telephone Encounter (Signed)
Spoke with pateint. Reports yeast symptoms for 1 month, have improved, not resolved.   Self treated with OTC monistat.  Seen by PCP, treated with diflucan x1, fluconazole 100mg  x7 days and again,  fluconazole 100mg  x7 days, is on last dose.   Reports pelvic pressure and "wheaty" urine smell, denies d/c.   Recommended OV for further evaluation, OV scheduled for 3/7 at 9am with Dr. Quincy Simmonds.   Routing to provider for final review. Patient is agreeable to disposition. Will close encounter.

## 2017-03-18 ENCOUNTER — Ambulatory Visit: Payer: BLUE CROSS/BLUE SHIELD | Admitting: Obstetrics and Gynecology

## 2017-03-18 ENCOUNTER — Encounter: Payer: Self-pay | Admitting: Obstetrics and Gynecology

## 2017-03-18 VITALS — BP 122/76 | HR 60 | Resp 14 | Wt 152.0 lb

## 2017-03-18 DIAGNOSIS — N9489 Other specified conditions associated with female genital organs and menstrual cycle: Secondary | ICD-10-CM

## 2017-03-18 DIAGNOSIS — R102 Pelvic and perineal pain: Secondary | ICD-10-CM | POA: Diagnosis not present

## 2017-03-18 LAB — POCT URINALYSIS DIPSTICK
Bilirubin, UA: NEGATIVE
Glucose, UA: NEGATIVE
Ketones, UA: NEGATIVE
Nitrite, UA: NEGATIVE
PROTEIN UA: NEGATIVE
RBC UA: NEGATIVE
Urobilinogen, UA: 0.2 E.U./dL
pH, UA: 5 (ref 5.0–8.0)

## 2017-03-18 NOTE — Progress Notes (Signed)
GYNECOLOGY  VISIT   HPI: 67 y.o.   Married  Caucasian  female   G2P2002 with Patient's last menstrual period was 01/12/2001.   here for possible yeast infection -- patient complains of pressure in pelvic area.  Presumptive yeast infection 2 months ago. Started after not taking her leotards off quickly. No new exercise plan.  tx with Monistat. tx with Diflucan x 3 courses and symptoms persist.   No wet prep or testing was done.   Feels pressure in her pelvis and burning on the outside. Points to the left labia.  Notes odor that smells like bread.  No discharge.   No dysuria.  Some urgency.   No diarrhea or constipation.   Had a persistent YI in the past and it was tx with Vagitrol.  Taking Advil 200 mg daily for URI.   No partner change.   GYNECOLOGIC HISTORY: Patient's last menstrual period was 01/12/2001. Contraception:  Postmenopausal Menopausal hormone therapy:  none Last mammogram:  12/07/16 BIRADS 1 negative/density b Last pap smear:   11/15/15 Pap and HR HPV negative        OB History    Gravida Para Term Preterm AB Living   2 2 2     2    SAB TAB Ectopic Multiple Live Births                     There are no active problems to display for this patient.   No past medical history on file.  Past Surgical History:  Procedure Laterality Date  . APPENDECTOMY    . CESAREAN SECTION  1986, 1989    Current Outpatient Medications  Medication Sig Dispense Refill  . Multiple Vitamin (MULTIVITAMIN) tablet Take 1 tablet by mouth daily.     No current facility-administered medications for this visit.      ALLERGIES: Patient has no known allergies.  Family History  Problem Relation Age of Onset  . Stroke Mother   . Depression Brother   . Heart disease Brother   . Breast cancer Maternal Aunt   . Ovarian cancer Paternal Grandmother     Social History   Socioeconomic History  . Marital status: Married    Spouse name: Not on file  . Number of children:  Not on file  . Years of education: Not on file  . Highest education level: Not on file  Social Needs  . Financial resource strain: Not on file  . Food insecurity - worry: Not on file  . Food insecurity - inability: Not on file  . Transportation needs - medical: Not on file  . Transportation needs - non-medical: Not on file  Occupational History  . Not on file  Tobacco Use  . Smoking status: Never Smoker  . Smokeless tobacco: Never Used  Substance and Sexual Activity  . Alcohol use: Yes    Alcohol/week: 3.6 oz    Types: 6 Standard drinks or equivalent per week    Comment: 2 glasses of wine per day  . Drug use: No  . Sexual activity: Yes    Partners: Male    Birth control/protection: Post-menopausal  Other Topics Concern  . Not on file  Social History Narrative  . Not on file    ROS:  Pertinent items are noted in HPI.  PHYSICAL EXAMINATION:    BP 122/76 (BP Location: Right Arm, Patient Position: Sitting, Cuff Size: Normal)   Pulse 60   Resp 14   Wt 152 lb (68.9  kg)   LMP 01/12/2001   BMI 23.11 kg/m     General appearance: alert, cooperative and appears stated age   Pelvic: External genitalia:   left labial tenderness.  Small vein in left labia majora.               Urethra:  normal appearing urethra with no masses, tenderness or lesions              Bartholins and Skenes: normal                 Vagina: normal appearing vagina with normal color and discharge, no lesions              Cervix: no lesions                Bimanual Exam:  Uterus:  normal size, contour, position, consistency, mobility, non-tender              Adnexa: no mass, fullness, tenderness             Chaperone was present for exam.  ASSESSMENT  Pelvic pressure.  Vulvar burning.  Treated for presumptive yeast infection.  No hernia noted. Abnormal urine.  PLAN  Affirm, urine micro and culture.  No abx at this time.  Advil 400 mg po q 6 hours x 48 - 72 hours. Heating pad to vulva. If  testing is normal, will order pelvic US.   An After Visit Summary was printed and given to the patient.  _15_____ minutes face to face time of which over 50% was spent in counseling.

## 2017-03-19 LAB — VAGINITIS/VAGINOSIS, DNA PROBE
Candida Species: NEGATIVE
GARDNERELLA VAGINALIS: NEGATIVE
Trichomonas vaginosis: NEGATIVE

## 2017-03-26 ENCOUNTER — Ambulatory Visit: Payer: BLUE CROSS/BLUE SHIELD | Admitting: *Deleted

## 2017-03-26 VITALS — BP 132/86 | HR 60 | Temp 98.2°F

## 2017-03-26 DIAGNOSIS — R102 Pelvic and perineal pain: Secondary | ICD-10-CM

## 2017-03-26 NOTE — Progress Notes (Signed)
Patient recently treated for yeast infection by PCP. Symptoms improved but still has some pelvic pressure.  Office visit with Dr Quincy Simmonds on 03-18-17. Urine specimen processing issue with lab at that visit. Patient here today for repeat urine specimen collection.  BP slightly above patient normal. Reports she has had 4 large cups of "high octane" coffee and nothing to eat. Encouraged to eat protein meal and drink water. Monitor prn.  Advised will call with lab results.

## 2017-03-27 LAB — URINALYSIS, MICROSCOPIC ONLY
Bacteria, UA: NONE SEEN
Casts: NONE SEEN /lpf
Epithelial Cells (non renal): NONE SEEN /hpf (ref 0–10)
RBC, UA: NONE SEEN /hpf (ref 0–?)

## 2017-03-28 LAB — URINE CULTURE

## 2017-03-29 ENCOUNTER — Other Ambulatory Visit: Payer: Self-pay | Admitting: *Deleted

## 2017-03-29 MED ORDER — SULFAMETHOXAZOLE-TRIMETHOPRIM 800-160 MG PO TABS: 1.0000 | ORAL_TABLET | Freq: Two times a day (BID) | ORAL | 0 refills | Status: DC

## 2017-03-31 ENCOUNTER — Telehealth: Payer: Self-pay | Admitting: Obstetrics and Gynecology

## 2017-03-31 MED ORDER — CIPROFLOXACIN HCL 500 MG PO TABS: 500.0000 mg | ORAL_TABLET | Freq: Two times a day (BID) | ORAL | 0 refills | Status: DC

## 2017-03-31 NOTE — Telephone Encounter (Signed)
Spoke with patient, advised as seen below per Dr. Quincy Simmonds. Rx for Cipro to verified pharmacy. Patient verbalizes understanding.  Routing to provider for final review. Patient is agreeable to disposition. Will close encounter.

## 2017-03-31 NOTE — Telephone Encounter (Signed)
Spoke with patient. Currently being treated for UTI with Bactrim DS, has 2 doses left, calling with update.   Reports she feels "fine during the day", feels increase pressure and frequency in the evenings starting around 5pm, has hard time sleeping at night d/t discomfort.  "Drinks a ton of water". Is concerned bactrim will not be effective. Patient states she has not had a UTI since 1999, was treated with cipro, "it was cured".   Denies any new symptoms, fever/chills, N/V, lower back pain.   Advised will update Dr. Quincy Simmonds and our office will return call with recommendations, patient is agreeable.  Dr. Quincy Simmonds, please advise?

## 2017-03-31 NOTE — Telephone Encounter (Signed)
Patient would like to speak with nurse to tell her how she is doing on medication.

## 2017-03-31 NOTE — Telephone Encounter (Signed)
Ok for Cipro 500 mg po bid x 7 days.  If symptoms persist, she needs a pelvic US.

## 2017-08-16 DIAGNOSIS — R69 Illness, unspecified: Secondary | ICD-10-CM | POA: Diagnosis not present

## 2017-08-18 DIAGNOSIS — H524 Presbyopia: Secondary | ICD-10-CM | POA: Diagnosis not present

## 2017-08-28 ENCOUNTER — Telehealth: Payer: Self-pay | Admitting: Obstetrics and Gynecology

## 2017-08-28 NOTE — Telephone Encounter (Signed)
I answered a call from the answering service. Casey Villarreal C/O UTI symptoms. Septra DS BID x 3 days called to [phone]. Dr. Raphael Gibney

## 2017-08-30 ENCOUNTER — Telehealth: Payer: Self-pay | Admitting: Obstetrics and Gynecology

## 2017-08-30 NOTE — Telephone Encounter (Signed)
Patient returned Emily's call. The patient is now aware her voicemail is full and she'll clean out her phone. She said she's feeling much better and declined an appointment until speaking with the nurse.

## 2017-08-30 NOTE — Telephone Encounter (Signed)
Attempted to reach patient. Mailbox full. Unable to leave a message.

## 2017-08-30 NOTE — Telephone Encounter (Signed)
Returned call to patient. Patient states, "I've honestly felt the best that I've ever felt." States Dr. Raphael Gibney prescribed bactrim ds BID for 3 days and patient will finish today. Was experiencing pressure and burning and all symptoms have now resolved. Patient questioning if she still needs to be seen? Asking for Dr. Elza Rafter recommendations on cranberry tablets or vaginal estrogen cream at this point as a preventative. States she has never used hormonal cream before, but is open to that option. RN advised would need to review with Dr. Quincy Simmonds and return call with her recommendations. Patient agreeable.   Routing to provider for review.

## 2017-08-30 NOTE — Telephone Encounter (Signed)
Called patient to get her scheduled for follow up with Dr. Quincy Simmonds. Patient was prescribed antibiotics over the weekend from Dr Raphael Gibney for uti symptoms and was told to call office this week for an appointment.

## 2017-08-30 NOTE — Telephone Encounter (Signed)
Call to patient. Message given to patient as seen below from Dr. Quincy Simmonds. Patient agreeable to schedule appointment. Patient scheduled for Wednesday 09-01-17 at 1400. Patient agreeable to date and time of appointment.   Routing to provider for final review. Patient agreeable to disposition. Will close encounter.

## 2017-08-30 NOTE — Telephone Encounter (Signed)
I do recommend an office visit.  Patient has had recurrent symptoms and UTIs. I would like to develop a strategy to reduce those infections.

## 2017-09-01 ENCOUNTER — Ambulatory Visit (INDEPENDENT_AMBULATORY_CARE_PROVIDER_SITE_OTHER): Payer: Medicare HMO | Admitting: Obstetrics and Gynecology

## 2017-09-01 ENCOUNTER — Encounter: Payer: Self-pay | Admitting: Obstetrics and Gynecology

## 2017-09-01 VITALS — BP 120/72 | HR 64 | Temp 98.0°F | Resp 16 | Ht 68.0 in | Wt 148.0 lb

## 2017-09-01 DIAGNOSIS — N952 Postmenopausal atrophic vaginitis: Secondary | ICD-10-CM

## 2017-09-01 DIAGNOSIS — R300 Dysuria: Secondary | ICD-10-CM

## 2017-09-01 MED ORDER — ESTROGENS, CONJUGATED 0.625 MG/GM VA CREA: TOPICAL_CREAM | VAGINAL | 0 refills | Status: DC

## 2017-09-01 NOTE — Patient Instructions (Signed)
Atrophic Vaginitis Atrophic vaginitis is when the tissues that line the vagina become dry and thin. This is caused by a drop in estrogen. Estrogen helps:  To keep the vagina moist.  To make a clear fluid that helps: ? To lubricate the vagina for sex. ? To protect the vagina from infection.  If the lining of the vagina is dry and thin, it may:  Make sex painful. It may also cause bleeding.  Cause a feeling of: ? Burning. ? Irritation. ? Itchiness.  Make an exam of your vagina painful. It may also cause bleeding.  Make you lose interest in sex.  Cause a burning feeling when you pee.  Make your vaginal fluid (discharge) brown or yellow.  For some women, there are no symptoms. This condition is most common in women who do not get their regular menstrual periods anymore (menopause). This often starts when a woman is 45-55 years old. Follow these instructions at home:  Take medicines only as told by your doctor. Do not use any herbal or alternative medicines unless your doctor says it is okay.  Use over-the-counter products for dryness only as told by your doctor. These include: ? Creams. ? Lubricants. ? Moisturizers.  Do not douche.  Do not use products that can make your vagina dry. These include: ? Scented feminine sprays. ? Scented tampons. ? Scented soaps.  If it hurts to have sex, tell your sexual partner. Contact a doctor if:  Your discharge looks different than normal.  Your vagina has an unusual smell.  You have new symptoms.  Your symptoms do not get better with treatment.  Your symptoms get worse. This information is not intended to replace advice given to you by your health care provider. Make sure you discuss any questions you have with your health care provider. Document Released: 06/17/2007 Document Revised: 06/06/2015 Document Reviewed: 12/20/2013 Elsevier Interactive Patient Education  2018 Elsevier Inc.  

## 2017-09-01 NOTE — Progress Notes (Signed)
GYNECOLOGY  VISIT   HPI: 67 y.o.   Married  Caucasian  female   G2P2002 with Patient's last menstrual period was 03/01/2002.   here for recurrent UTI -- patient had UTI and was treated with Bactrim. Patient states that she has completed the course of abx 08/31/17 and is feeling better.    States that she has a funny feeling in the urethra or vaginal area.  No urgency or pain.  No blood in the urine.   Patient states no UTI or yeast infection in eons.  She was treated with Klebsiella with Bactrim DS and then Ciprofloxacin.   Reports a history of UTIs with hematuria in the past.   She states she had sexual activity prior to this last infection.  Uses KY jelly for lubrication.   Does Barr and Pilates.   GYNECOLOGIC HISTORY: Patient's last menstrual period was 03/01/2002. Contraception:  Postmenopausal Menopausal hormone therapy:  none Last mammogram:  12/07/16 BIRADS 1 negative/density b Last pap smear:   11/15/15 Neg:Neg HR HPV        OB History    Gravida  2   Para  2   Term  2   Preterm      AB      Living  2     SAB      TAB      Ectopic      Multiple      Live Births                 There are no active problems to display for this patient.   History reviewed. No pertinent past medical history.  Past Surgical History:  Procedure Laterality Date  . APPENDECTOMY    . CESAREAN SECTION  1986, 1989    Current Outpatient Medications  Medication Sig Dispense Refill  . Multiple Vitamin (MULTIVITAMIN) tablet Take 1 tablet by mouth daily.     No current facility-administered medications for this visit.      ALLERGIES: Patient has no known allergies.  Family History  Problem Relation Age of Onset  . Stroke Mother   . Depression Brother   . Heart disease Brother   . Breast cancer Maternal Aunt   . Ovarian cancer Paternal Grandmother     Social History   Socioeconomic History  . Marital status: Married    Spouse name: Not on file  . Number  of children: Not on file  . Years of education: Not on file  . Highest education level: Not on file  Occupational History  . Not on file  Social Needs  . Financial resource strain: Not on file  . Food insecurity:    Worry: Not on file    Inability: Not on file  . Transportation needs:    Medical: Not on file    Non-medical: Not on file  Tobacco Use  . Smoking status: Never Smoker  . Smokeless tobacco: Never Used  Substance and Sexual Activity  . Alcohol use: Yes    Alcohol/week: 6.0 standard drinks    Types: 6 Standard drinks or equivalent per week    Comment: 2 glasses of wine per day  . Drug use: No  . Sexual activity: Yes    Partners: Male    Birth control/protection: Post-menopausal  Lifestyle  . Physical activity:    Days per week: Not on file    Minutes per session: Not on file  . Stress: Not on file  Relationships  . Social connections:  Talks on phone: Not on file    Gets together: Not on file    Attends religious service: Not on file    Active member of club or organization: Not on file    Attends meetings of clubs or organizations: Not on file    Relationship status: Not on file  . Intimate partner violence:    Fear of current or ex partner: Not on file    Emotionally abused: Not on file    Physically abused: Not on file    Forced sexual activity: Not on file  Other Topics Concern  . Not on file  Social History Narrative  . Not on file    Review of Systems  All other systems reviewed and are negative.   PHYSICAL EXAMINATION:    BP 120/72 (BP Location: Right Arm, Patient Position: Sitting, Cuff Size: Normal)   Pulse 64   Temp 98 F (36.7 C) (Oral)   Resp 16   Ht 5\' 8"  (1.727 m)   Wt 148 lb (67.1 kg)   LMP 03/01/2002   BMI 22.50 kg/m     General appearance: alert, cooperative and appears stated age  Pelvic: External genitalia:  no lesions              Urethra:  normal appearing urethra with no masses, tenderness or lesions               Bartholins and Skenes: normal                 Vagina: normal appearing vagina with normal color and discharge, no lesions              Cervix: no lesions                Bimanual Exam:  Uterus:  normal size, contour, position, consistency, mobility, non-tender              Adnexa: no mass, fullness, tenderness                        Anus: Erythema around the rectum.   Chaperone was present for exam.  ASSESSMENT  Potential recurrent UTI.  Vaginal atrophy.  PLAN  We discussed UTIs. Urine culture not needed today.  We reviewed urogenital atrophy and symptoms.  Will start vaginal estrogen cream, Premarin 1/2 gm pv at to urethra at hs x 2 weeks and then twice weekly.  I discussed potential effect of local vaginal estrogen on breast cancer.  Mammogram due in November, 2019.    An After Visit Summary was printed and given to the patient.  __25___ minutes face to face time of which over 50% was spent in counseling.

## 2017-09-10 DIAGNOSIS — M25552 Pain in left hip: Secondary | ICD-10-CM | POA: Diagnosis not present

## 2017-09-10 DIAGNOSIS — M7062 Trochanteric bursitis, left hip: Secondary | ICD-10-CM | POA: Diagnosis not present

## 2017-09-15 ENCOUNTER — Telehealth: Payer: Self-pay | Admitting: Obstetrics and Gynecology

## 2017-09-15 NOTE — Telephone Encounter (Signed)
Schedule office visit with me.

## 2017-09-15 NOTE — Telephone Encounter (Signed)
Spoke with patient. She states about 30 minutes ago she had one episode of bright red vaginal bleeding, noted blood on toilet tissue after void. She is sure it came from the vaginal area and states she checked her rectum for bleeding. States vaginal bleeding has stopped.   Started  Premarin vaginal cream on Sunday 09/05/17 used every night as directed but forgot to place cream for the last two nights.  Denies dysuria or UTI symptoms.  Concerned about future use of Premarin cream and family hx of ovarian cancer. Advised patient will review with Dr. Quincy Simmonds and return call. Pt agreeable to plan.

## 2017-09-15 NOTE — Telephone Encounter (Signed)
Patient is asking to talk with a nurse about the bleeding she just abnormal experienced.

## 2017-09-15 NOTE — Telephone Encounter (Signed)
Return call to patient. No further bleeding.  Office visit 09/16/2017 with Dr. Quincy Simmonds.  Encounter closed.

## 2017-09-16 ENCOUNTER — Other Ambulatory Visit: Payer: Self-pay | Admitting: Obstetrics and Gynecology

## 2017-09-16 ENCOUNTER — Encounter: Payer: Self-pay | Admitting: Obstetrics and Gynecology

## 2017-09-16 ENCOUNTER — Ambulatory Visit (INDEPENDENT_AMBULATORY_CARE_PROVIDER_SITE_OTHER): Payer: Medicare HMO | Admitting: Obstetrics and Gynecology

## 2017-09-16 ENCOUNTER — Other Ambulatory Visit (HOSPITAL_COMMUNITY)
Admission: RE | Admit: 2017-09-16 | Discharge: 2017-09-16 | Disposition: A | Discharge status: Home / Self Care | Payer: Medicare HMO | Source: Ambulatory Visit | Attending: Obstetrics and Gynecology | Admitting: Obstetrics and Gynecology

## 2017-09-16 VITALS — BP 140/82 | HR 80 | Temp 98.2°F | Resp 16 | Ht 68.0 in | Wt 150.0 lb

## 2017-09-16 DIAGNOSIS — N840 Polyp of corpus uteri: Secondary | ICD-10-CM | POA: Diagnosis not present

## 2017-09-16 DIAGNOSIS — M25449 Effusion, unspecified hand: Secondary | ICD-10-CM | POA: Diagnosis not present

## 2017-09-16 DIAGNOSIS — Z124 Encounter for screening for malignant neoplasm of cervix: Secondary | ICD-10-CM | POA: Insufficient documentation

## 2017-09-16 DIAGNOSIS — N950 Postmenopausal bleeding: Secondary | ICD-10-CM

## 2017-09-16 DIAGNOSIS — M25441 Effusion, right hand: Secondary | ICD-10-CM | POA: Diagnosis not present

## 2017-09-16 NOTE — Progress Notes (Signed)
Patient scheduled while in office for PUS on 9/6 at 10am at [place] patient to arrive with full bladder and to drink 32 oz water 1 hr prior to appt. Patient verbalizes understanding and is agreeable.

## 2017-09-16 NOTE — Progress Notes (Signed)
GYNECOLOGY  VISIT   HPI: 67 y.o.   Married  Caucasian  female   G2P2002 with Patient's last menstrual period was 03/01/2002.   here for vaginal bleeding started yesterday. Thinks it is really coming from the vagina.   Since yesterday having bright red bleeding.  States she felt some twinges in the ovarian area which have resolved.  Used vaginal estrogen cream.  Using a full applicator full at a time.  Did insertions on August 25, 26, 27, 28.   Made her feel more comfortable.   Intercourse was August 30 and 31.   No menses since age 47 yo.      PGM deceased from ovarian cancer.   Doing a lot of traveling for work.   GYNECOLOGIC HISTORY: Patient's last menstrual period was 03/01/2002. Contraception:  Postmenopausal Menopausal hormone therapy:  none Last mammogram:  12/07/16 BIRADS 1 negative/density b Last pap smear:   11/15/15 Neg:Neg HR HPV        OB History    Gravida  2   Para  2   Term  2   Preterm      AB      Living  2     SAB      TAB      Ectopic      Multiple      Live Births                 There are no active problems to display for this patient.   History reviewed. No pertinent past medical history.  Past Surgical History:  Procedure Laterality Date  . APPENDECTOMY    . CESAREAN SECTION  1986, 1989    Current Outpatient Medications  Medication Sig Dispense Refill  . Multiple Vitamin (MULTIVITAMIN) tablet Take 1 tablet by mouth daily.    Marland Kitchen conjugated estrogens (PREMARIN) vaginal cream Use 1/2 g vaginally every night at bed time for the first 2 weeks, then use 1/2 g vaginally two times per week. (Patient not taking: Reported on 09/16/2017) 60 g 0   No current facility-administered medications for this visit.      ALLERGIES: Patient has no known allergies.  Family History  Problem Relation Age of Onset  . Stroke Mother   . Depression Brother   . Heart disease Brother   . Breast cancer Maternal Aunt   . Ovarian cancer Paternal  Grandmother     Social History   Socioeconomic History  . Marital status: Married    Spouse name: Not on file  . Number of children: Not on file  . Years of education: Not on file  . Highest education level: Not on file  Occupational History  . Not on file  Social Needs  . Financial resource strain: Not on file  . Food insecurity:    Worry: Not on file    Inability: Not on file  . Transportation needs:    Medical: Not on file    Non-medical: Not on file  Tobacco Use  . Smoking status: Never Smoker  . Smokeless tobacco: Never Used  Substance and Sexual Activity  . Alcohol use: Yes    Alcohol/week: 6.0 standard drinks    Types: 6 Standard drinks or equivalent per week    Comment: 2 glasses of wine per day  . Drug use: No  . Sexual activity: Yes    Partners: Male    Birth control/protection: Post-menopausal  Lifestyle  . Physical activity:    Days per week:  Not on file    Minutes per session: Not on file  . Stress: Not on file  Relationships  . Social connections:    Talks on phone: Not on file    Gets together: Not on file    Attends religious service: Not on file    Active member of club or organization: Not on file    Attends meetings of clubs or organizations: Not on file    Relationship status: Not on file  . Intimate partner violence:    Fear of current or ex partner: Not on file    Emotionally abused: Not on file    Physically abused: Not on file    Forced sexual activity: Not on file  Other Topics Concern  . Not on file  Social History Narrative  . Not on file    Review of Systems  Genitourinary: Positive for vaginal bleeding.  All other systems reviewed and are negative.   PHYSICAL EXAMINATION:    BP 140/82 (BP Location: Right Arm, Patient Position: Sitting, Cuff Size: Normal)   Pulse 80   Temp 98.2 F (36.8 C) (Oral)   Resp 16   Ht 5\' 8"  (1.727 m)   Wt 150 lb (68 kg)   LMP 03/01/2002   BMI 22.81 kg/m     General appearance: alert,  cooperative and appears stated age  Pelvic: External genitalia:  no lesions              Urethra:  normal appearing urethra with no masses, tenderness or lesions              Bartholins and Skenes: normal                 Vagina: normal appearing vagina with normal color and discharge, no lesions.  Blood noted.              Cervix: no lesions                Bimanual Exam:  Uterus:  normal size, contour, position, consistency, mobility, non-tender              Adnexa: no mass, fullness, tenderness     Endometrial biopsy Consent for procedure. Sterile prep with betadine.  Paracervical block with 1% lidocaine 6 cc, lot number   [id number]                   , expiration 01/23. Tenaculum to anterior cervical lip. Pipelle passed to    9      cm twice.   Tissue to pathology.  Minimal EBL. No complications.   Chaperone was present for exam.  ASSESSMENT  Postmenopausal bleeding.  Recent initiation of vaginal estrogen cream.  FH ovarian cancer.  PLAN  Discussion of postmenopausal bleeding and possible etiologies - atrophy, hormone use, infection, polyp, malignancy.  Pap taken. FU EMB.  Stop vaginal estrogen.  Schedule pelvic US.    An After Visit Summary was printed and given to the patient.  __15____ minutes face to face time of which over 50% was spent in counseling.

## 2017-09-16 NOTE — Patient Instructions (Signed)
Endometrial Biopsy, Care After This sheet gives you information about how to care for yourself after your procedure. Your health care provider may also give you more specific instructions. If you have problems or questions, contact your health care provider. What can I expect after the procedure? After the procedure, it is common to have:  Mild cramping.  A small amount of vaginal bleeding for a few days. This is normal.  Follow these instructions at home:  Take over-the-counter and prescription medicines only as told by your health care provider.  Do not douche, use tampons, or have sexual intercourse until your health care provider approves.  Return to your normal activities as told by your health care provider. Ask your health care provider what activities are safe for you.  Follow instructions from your health care provider about any activity restrictions, such as restrictions on strenuous exercise or heavy lifting. Contact a health care provider if:  You have heavy bleeding, or bleed for longer than 2 days after the procedure.  You have bad smelling discharge from your vagina.  You have a fever or chills.  You have a burning sensation when urinating or you have difficulty urinating.  You have severe pain in your lower abdomen. Get help right away if:  You have severe cramps in your stomach or back.  You pass large blood clots.  Your bleeding increases.  You become weak or light-headed, or you pass out. Summary  After the procedure, it is common to have mild cramping and a small amount of vaginal bleeding for a few days.  Do not douche, use tampons, or have sexual intercourse until your health care provider approves.  Return to your normal activities as told by your health care provider. Ask your health care provider what activities are safe for you. This information is not intended to replace advice given to you by your health care provider. Make sure you discuss any  questions you have with your health care provider. Document Released: 10/19/2012 Document Revised: 01/15/2016 Document Reviewed: 01/15/2016 Elsevier Interactive Patient Education  2017 [employer] Postmenopausal Bleeding Postmenopausal bleeding is any bleeding a woman has after she has entered into menopause. Menopause is the end of a woman's fertile years. After menopause, a woman no longer ovulates or has menstrual periods. Postmenopausal bleeding can be caused by various things. Any type of postmenopausal bleeding, even if it appears to be a typical menstrual period, is concerning. This should be evaluated by your health care provider. Any treatment will depend on the cause of the bleeding. Follow these instructions at home: Monitor your condition for any changes. The following actions may help to alleviate any discomfort you are experiencing:  Avoid the use of tampons and douches as directed by your health care provider.  Change your pads frequently.  Get regular pelvic exams and Pap tests.  Keep all follow-up appointments for diagnostic tests as directed by your health care provider.  Contact a health care provider if:  Your bleeding lasts more than 1 week.  You have abdominal pain.  You have bleeding with sexual intercourse. Get help right away if:  You have a fever, chills, headache, dizziness, muscle aches, and bleeding.  You have severe pain with bleeding.  You are passing blood clots.  You have bleeding and need more than 1 pad an hour.  You feel faint. This information is not intended to replace advice given to you by your health care provider. Make sure you discuss any questions you have  with your health care provider. Document Released: 04/08/2005 Document Revised: 06/06/2015 Document Reviewed: 07/28/2012 Elsevier Interactive Patient Education  [employer].

## 2017-09-17 ENCOUNTER — Ambulatory Visit (HOSPITAL_COMMUNITY)
Admission: RE | Admit: 2017-09-17 | Discharge: 2017-09-17 | Disposition: A | Discharge status: Home / Self Care | Payer: Medicare HMO | Source: Ambulatory Visit | Attending: Obstetrics and Gynecology | Admitting: Obstetrics and Gynecology

## 2017-09-17 DIAGNOSIS — R9389 Abnormal findings on diagnostic imaging of other specified body structures: Secondary | ICD-10-CM | POA: Insufficient documentation

## 2017-09-17 DIAGNOSIS — N950 Postmenopausal bleeding: Secondary | ICD-10-CM | POA: Diagnosis not present

## 2017-09-17 LAB — CYTOLOGY - PAP: DIAGNOSIS: NEGATIVE

## 2017-09-18 DIAGNOSIS — N950 Postmenopausal bleeding: Secondary | ICD-10-CM | POA: Insufficient documentation

## 2017-09-22 ENCOUNTER — Telehealth: Payer: Self-pay | Admitting: Obstetrics and Gynecology

## 2017-09-22 NOTE — Telephone Encounter (Signed)
Call placed to patient to review benefits for recommended surgery. Left voicemail message requesting a return call  ° ° cc: Sally Yeakley, RN °

## 2017-09-23 NOTE — Telephone Encounter (Signed)
Case requested for 10-04-17. Call to patient. Left message to call back.

## 2017-09-23 NOTE — Telephone Encounter (Signed)
Patient returned call. Spoke with pt regarding benefit for surgery. Patient understood and agreeable. Patient aware of surgery cancellation policy.  Patient ready to schedule. Patient would like to consider 10/04/17 for surgery date.  Patient aware this is professional benefit only. Patient aware will be contacted by hospital for separate benefits. Forwarding to nurse supervisor for scheduling  Routing to Lamont Snowball, RN

## 2017-09-24 NOTE — Telephone Encounter (Signed)
Patient is returning a call to Sally. °

## 2017-09-24 NOTE — Telephone Encounter (Signed)
Call to patient. Advised of surgery date of 10-04-17 at 1200 pending  Consult with Dr Quincy Simmonds.  Office visit to discuss results and surgical options/plan scheduled for 09-27-17 at 0945.  Patient is with a client and is unable to talk. Will review remaining instructions at appointment on Monday.      Encounter closed.

## 2017-09-24 NOTE — Progress Notes (Signed)
GYNECOLOGY  VISIT   HPI: 67 y.o.   Married  Caucasian  female   G2P2002 with Patient's last menstrual period was 03/01/2002.   here for surgery consult.     Has postmenopausal bleeding, thickened endometrium, and EMB showing endometrial polyp.  GYNECOLOGIC HISTORY: Patient's last menstrual period was 03/01/2002. Contraception:  Postmenopausal Menopausal hormone therapy:  none Last mammogram:  12/07/16 BIRADS 1 negative/density b Last pap smear:  09/16/17 - negative        OB History    Gravida  2   Para  2   Term  2   Preterm      AB      Living  2     SAB      TAB      Ectopic      Multiple      Live Births                 Patient Active Problem List   Diagnosis Date Noted  . Postmenopausal bleeding 09/18/2017    No past medical history on file.  Past Surgical History:  Procedure Laterality Date  . APPENDECTOMY    . CESAREAN SECTION  1986, 1989    Current Outpatient Medications  Medication Sig Dispense Refill  . Multiple Vitamin (MULTIVITAMIN) tablet Take 1 tablet by mouth daily.     No current facility-administered medications for this visit.      ALLERGIES: Patient has no known allergies.  Family History  Problem Relation Age of Onset  . Stroke Mother   . Depression Brother   . Heart disease Brother   . Breast cancer Maternal Aunt   . Ovarian cancer Paternal Grandmother     Social History   Socioeconomic History  . Marital status: Married    Spouse name: Not on file  . Number of children: Not on file  . Years of education: Not on file  . Highest education level: Not on file  Occupational History  . Not on file  Social Needs  . Financial resource strain: Not on file  . Food insecurity:    Worry: Not on file    Inability: Not on file  . Transportation needs:    Medical: Not on file    Non-medical: Not on file  Tobacco Use  . Smoking status: Never Smoker  . Smokeless tobacco: Never Used  Substance and Sexual Activity  .  Alcohol use: Yes    Alcohol/week: 6.0 standard drinks    Types: 6 Standard drinks or equivalent per week    Comment: 2 glasses of wine per day  . Drug use: No  . Sexual activity: Yes    Partners: Male    Birth control/protection: Post-menopausal  Lifestyle  . Physical activity:    Days per week: Not on file    Minutes per session: Not on file  . Stress: Not on file  Relationships  . Social connections:    Talks on phone: Not on file    Gets together: Not on file    Attends religious service: Not on file    Active member of club or organization: Not on file    Attends meetings of clubs or organizations: Not on file    Relationship status: Not on file  . Intimate partner violence:    Fear of current or ex partner: Not on file    Emotionally abused: Not on file    Physically abused: Not on file    Forced sexual  activity: Not on file  Other Topics Concern  . Not on file  Social History Narrative  . Not on file    Review of Systems  Constitutional: Negative.   HENT: Negative.   Eyes: Negative.   Respiratory: Negative.   Cardiovascular: Negative.   Gastrointestinal: Negative.   Endocrine: Negative.   Genitourinary: Negative.   Musculoskeletal: Negative.   Skin: Negative.   Allergic/Immunologic: Negative.   Neurological: Negative.   Hematological: Negative.   Psychiatric/Behavioral: Negative.   All other systems reviewed and are negative.   PHYSICAL EXAMINATION:    Pulse (!) 56   Wt 150 lb (68 kg)   LMP 03/01/2002   BMI 22.81 kg/m     General appearance: alert, cooperative and appears stated age Head: Normocephalic, without obvious abnormality, atraumatic Neck: no adenopathy, supple, symmetrical, trachea midline and thyroid normal to inspection and palpation Lungs: clear to auscultation bilaterally Breasts: normal appearance, no masses or tenderness, No nipple retraction or dimpling, No nipple discharge or bleeding, No axillary or supraclavicular  adenopathy Heart: regular rate and rhythm Abdomen: soft, non-tender, no masses,  no organomegaly Extremities: extremities normal, atraumatic, no cyanosis or edema Skin: Skin color, texture, turgor normal. No rashes or lesions Lymph nodes: Cervical, supraclavicular, and axillary nodes normal. No abnormal inguinal nodes palpated Neurologic: Grossly normal  Pelvic:  deferred.  Chaperone was present for exam.  ASSESSMENT  Postmenopausal bleeding.  Endometrial polyp.  PLAN  Discussion of postmenopausal bleeding and endometrial polyp. Discussion of hysteroscopy with Myosure polypectomy, dilation and curettage.  Risks, benefits, and alternatives reviewed. Risks include but are not limited to bleeding, infection, damage to surrounding organs including uterine perforation requiring hospitalization and laparoscopy, reaction to anesthesia, DVT, PE, death, need for further treatment and surgery including repeat hysteroscopy, hysterectomy or medical therapy.   Surgical expectations and recovery discussed.  Patient wishes to proceed.   An After Visit Summary was printed and given to the patient.  __15____ minutes face to face time of which over 50% was spent in counseling.

## 2017-09-27 ENCOUNTER — Ambulatory Visit: Payer: Medicare HMO | Admitting: Obstetrics and Gynecology

## 2017-09-27 ENCOUNTER — Encounter (HOSPITAL_COMMUNITY): Payer: Self-pay

## 2017-09-27 ENCOUNTER — Other Ambulatory Visit: Payer: Self-pay

## 2017-09-27 ENCOUNTER — Encounter (HOSPITAL_COMMUNITY)
Admission: RE | Admit: 2017-09-27 | Discharge: 2017-09-27 | Disposition: A | Discharge status: Home / Self Care | Payer: Medicare HMO | Source: Ambulatory Visit | Attending: Obstetrics and Gynecology | Admitting: Obstetrics and Gynecology

## 2017-09-27 ENCOUNTER — Encounter: Payer: Self-pay | Admitting: Obstetrics and Gynecology

## 2017-09-27 VITALS — HR 56 | Wt 150.0 lb

## 2017-09-27 DIAGNOSIS — N950 Postmenopausal bleeding: Secondary | ICD-10-CM

## 2017-09-27 DIAGNOSIS — N840 Polyp of corpus uteri: Secondary | ICD-10-CM

## 2017-09-27 DIAGNOSIS — R2231 Localized swelling, mass and lump, right upper limb: Secondary | ICD-10-CM | POA: Diagnosis not present

## 2017-09-27 DIAGNOSIS — Z01818 Encounter for other preprocedural examination: Secondary | ICD-10-CM | POA: Insufficient documentation

## 2017-09-27 HISTORY — DX: Family history of other specified conditions: Z84.89

## 2017-09-27 LAB — CBC
HCT: 40.6 % (ref 36.0–46.0)
HEMOGLOBIN: 13.3 g/dL (ref 12.0–15.0)
MCH: 31 pg (ref 26.0–34.0)
MCHC: 32.8 g/dL (ref 30.0–36.0)
MCV: 94.6 fL (ref 78.0–100.0)
Platelets: 275 10*3/uL (ref 150–400)
RBC: 4.29 MIL/uL (ref 3.87–5.11)
RDW: 14.4 % (ref 11.5–15.5)
WBC: 4.1 10*3/uL (ref 4.0–10.5)

## 2017-09-27 LAB — BASIC METABOLIC PANEL
ANION GAP: 10 (ref 5–15)
BUN: 14 mg/dL (ref 8–23)
CO2: 26 mmol/L (ref 22–32)
Calcium: 9.2 mg/dL (ref 8.9–10.3)
Chloride: 105 mmol/L (ref 98–111)
Creatinine, Ser: 0.64 mg/dL (ref 0.44–1.00)
GFR calc Af Amer: >60 mL/min (ref >60)
GFR calc non Af Amer: >60 mL/min (ref >60)
GLUCOSE: 103 mg/dL — AB (ref 70–99)
Potassium: 4.2 mmol/L (ref 3.5–5.1)
Sodium: 141 mmol/L (ref 135–145)

## 2017-09-27 NOTE — Patient Instructions (Addendum)
Your procedure is scheduled on: Monday October 04, 2017 at 12:15pm  Enter through the Main Entrance of [facility] at: 68 am  Pick up the phone at the desk and dial [phone].  Call this number if you have problems the morning of surgery: [phone].  Remember: Do NOT eat food: after Midnight on Sunday September 22 Do NOT drink clear liquids after: 6:15 am Take these medicines the morning of surgery with a SIP OF WATER: NONE  STOP ALL VITAMINS, SUPPLEMENTS, HERBAL MEDICATIONS NOW  BRUSH YOUR TEETH DAY OF SURGERY  DO NOT SMOKE DAY OF SURGERY  Do NOT wear jewelry (body piercing), metal hair clips/bobby pins, make-up, or nail polish. Do NOT wear lotions, powders, or perfumes.  You may wear deoderant. Do NOT shave for 48 hours prior to surgery. Do NOT bring valuables to the hospital. Contacts, dentures, or bridgework may not be worn into surgery.  Have a responsible adult drive you home and stay with you for 24 hours after your procedure

## 2017-09-27 NOTE — Pre-Procedure Instructions (Signed)
Dr. Candida Peeling viewed EKG aware of history and elevated BP today, patient denies history of HTN,

## 2017-10-02 DIAGNOSIS — R2231 Localized swelling, mass and lump, right upper limb: Secondary | ICD-10-CM | POA: Diagnosis not present

## 2017-10-04 ENCOUNTER — Ambulatory Visit (HOSPITAL_COMMUNITY): Payer: Medicare HMO | Admitting: Anesthesiology

## 2017-10-04 ENCOUNTER — Other Ambulatory Visit: Payer: Self-pay

## 2017-10-04 ENCOUNTER — Encounter (HOSPITAL_COMMUNITY)
Admission: RE | Disposition: A | Discharge status: Home / Self Care | Payer: Self-pay | Source: Ambulatory Visit | Attending: Obstetrics and Gynecology

## 2017-10-04 ENCOUNTER — Encounter (HOSPITAL_COMMUNITY): Payer: Self-pay

## 2017-10-04 ENCOUNTER — Ambulatory Visit (HOSPITAL_COMMUNITY)
Admission: RE | Admit: 2017-10-04 | Discharge: 2017-10-04 | Disposition: A | Discharge status: Home / Self Care | Payer: Medicare HMO | Source: Ambulatory Visit | Attending: Obstetrics and Gynecology | Admitting: Obstetrics and Gynecology

## 2017-10-04 DIAGNOSIS — N950 Postmenopausal bleeding: Secondary | ICD-10-CM | POA: Insufficient documentation

## 2017-10-04 DIAGNOSIS — N840 Polyp of corpus uteri: Secondary | ICD-10-CM | POA: Insufficient documentation

## 2017-10-04 HISTORY — PX: DILATATION & CURETTAGE/HYSTEROSCOPY WITH MYOSURE: SHX6511

## 2017-10-04 SURGERY — DILATATION & CURETTAGE/HYSTEROSCOPY WITH MYOSURE
Anesthesia: General | Site: Vagina | Laterality: Bilateral

## 2017-10-04 MED ORDER — MIDAZOLAM HCL 2 MG/2ML IJ SOLN
INTRAMUSCULAR | Status: DC | PRN
  Administered 2017-10-04: 1 mg via INTRAVENOUS

## 2017-10-04 MED ORDER — DEXAMETHASONE SODIUM PHOSPHATE 4 MG/ML IJ SOLN
INTRAMUSCULAR | Status: AC
  Filled 2017-10-04: qty 1

## 2017-10-04 MED ORDER — LIDOCAINE HCL (CARDIAC) PF 100 MG/5ML IV SOSY
PREFILLED_SYRINGE | INTRAVENOUS | Status: AC
  Filled 2017-10-04: qty 5

## 2017-10-04 MED ORDER — MIDAZOLAM HCL 2 MG/2ML IJ SOLN
INTRAMUSCULAR | Status: AC
  Filled 2017-10-04: qty 2

## 2017-10-04 MED ORDER — LACTATED RINGERS IV SOLN
INTRAVENOUS | Status: DC
  Administered 2017-10-04 (×2): via INTRAVENOUS

## 2017-10-04 MED ORDER — ONDANSETRON HCL 4 MG/2ML IJ SOLN
INTRAMUSCULAR | Status: AC
  Filled 2017-10-04: qty 2

## 2017-10-04 MED ORDER — LIDOCAINE HCL 1 % IJ SOLN
INTRAMUSCULAR | Status: DC | PRN
  Administered 2017-10-04: 10 mL

## 2017-10-04 MED ORDER — IBUPROFEN 800 MG PO TABS: 800.0000 mg | ORAL_TABLET | Freq: Three times a day (TID) | ORAL | 0 refills | Status: DC | PRN

## 2017-10-04 MED ORDER — PROPOFOL 10 MG/ML IV BOLUS
INTRAVENOUS | Status: AC
  Filled 2017-10-04: qty 20

## 2017-10-04 MED ORDER — KETOROLAC TROMETHAMINE 30 MG/ML IJ SOLN
INTRAMUSCULAR | Status: AC
  Filled 2017-10-04: qty 1

## 2017-10-04 MED ORDER — PROPOFOL 10 MG/ML IV BOLUS
INTRAVENOUS | Status: DC | PRN
  Administered 2017-10-04: 150 mg via INTRAVENOUS

## 2017-10-04 MED ORDER — SODIUM CHLORIDE 0.9 % IR SOLN
Status: DC | PRN
  Administered 2017-10-04: 3000 mL

## 2017-10-04 MED ORDER — DEXAMETHASONE SODIUM PHOSPHATE 10 MG/ML IJ SOLN
INTRAMUSCULAR | Status: DC | PRN
  Administered 2017-10-04: 4 mg via INTRAVENOUS

## 2017-10-04 MED ORDER — KETOROLAC TROMETHAMINE 30 MG/ML IJ SOLN
INTRAMUSCULAR | Status: DC | PRN
  Administered 2017-10-04: 30 mg via INTRAVENOUS

## 2017-10-04 MED ORDER — LIDOCAINE HCL 1 % IJ SOLN
INTRAMUSCULAR | Status: AC
  Filled 2017-10-04: qty 20

## 2017-10-04 MED ORDER — FENTANYL CITRATE (PF) 100 MCG/2ML IJ SOLN
INTRAMUSCULAR | Status: DC | PRN
  Administered 2017-10-04 (×2): 50 ug via INTRAVENOUS

## 2017-10-04 MED ORDER — LIDOCAINE HCL (CARDIAC) PF 100 MG/5ML IV SOSY
PREFILLED_SYRINGE | INTRAVENOUS | Status: DC | PRN
  Administered 2017-10-04: 100 mg via INTRAVENOUS

## 2017-10-04 MED ORDER — FENTANYL CITRATE (PF) 100 MCG/2ML IJ SOLN: 25.0000 ug | INTRAMUSCULAR | Status: DC | PRN

## 2017-10-04 MED ORDER — FENTANYL CITRATE (PF) 250 MCG/5ML IJ SOLN
INTRAMUSCULAR | Status: AC
  Filled 2017-10-04: qty 5

## 2017-10-04 MED ORDER — ONDANSETRON HCL 4 MG/2ML IJ SOLN
INTRAMUSCULAR | Status: DC | PRN
  Administered 2017-10-04: 4 mg via INTRAVENOUS

## 2017-10-04 SURGICAL SUPPLY — 15 items
CANISTER SUCT 3000ML PPV (MISCELLANEOUS) ×2 IMPLANT
CATH ROBINSON RED A/P 16FR (CATHETERS) ×2 IMPLANT
DEVICE MYOSURE LITE (MISCELLANEOUS) ×2 IMPLANT
DEVICE MYOSURE REACH (MISCELLANEOUS) IMPLANT
FILTER ARTHROSCOPY CONVERTOR (FILTER) ×2 IMPLANT
GLOVE BIO SURGEON STRL SZ 6.5 (GLOVE) ×2 IMPLANT
GLOVE BIOGEL PI IND STRL 7.0 (GLOVE) ×1 IMPLANT
GLOVE BIOGEL PI INDICATOR 7.0 (GLOVE) ×1
GOWN STRL REUS W/TWL LRG LVL3 (GOWN DISPOSABLE) ×4 IMPLANT
PACK VAGINAL MINOR WOMEN LF (CUSTOM PROCEDURE TRAY) ×2 IMPLANT
PAD OB MATERNITY 4.3X12.25 (PERSONAL CARE ITEMS) ×2 IMPLANT
SEAL ROD LENS SCOPE MYOSURE (ABLATOR) ×2 IMPLANT
TOWEL OR 17X24 6PK STRL BLUE (TOWEL DISPOSABLE) ×4 IMPLANT
TUBING AQUILEX INFLOW (TUBING) ×2 IMPLANT
TUBING AQUILEX OUTFLOW (TUBING) ×2 IMPLANT

## 2017-10-04 NOTE — Anesthesia Postprocedure Evaluation (Signed)
Anesthesia Post Note  Patient: Casey Villarreal  Procedure(s) Performed: DILATATION & CURETTAGE/HYSTEROSCOPY WITH MYOSURE (Bilateral Vagina )     Patient location during evaluation: PACU Anesthesia Type: General Level of consciousness: awake and alert Pain management: pain level controlled Vital Signs Assessment: post-procedure vital signs reviewed and stable Respiratory status: spontaneous breathing, nonlabored ventilation, respiratory function stable and patient connected to nasal cannula oxygen Cardiovascular status: blood pressure returned to baseline and stable Postop Assessment: no apparent nausea or vomiting Anesthetic complications: no    Last Vitals:  Vitals:   10/04/17 1230 10/04/17 1242  BP: (!) 157/84 (!) 156/86  Pulse: (!) 56 (!) 54  Resp: 17 20  Temp:    SpO2: 100% 99%    Last Pain:  Vitals:   10/04/17 1242  TempSrc:   PainSc: 3    Pain Goal: Patients Stated Pain Goal: 3 (10/04/17 1002)               Leslieann Whisman L Rilla Buckman

## 2017-10-04 NOTE — Anesthesia Procedure Notes (Signed)
Procedure Name: LMA Insertion Date/Time: 10/04/2017 11:16 AM Performed by: Asher Muir, CRNA Pre-anesthesia Checklist: Patient being monitored, Patient identified, Emergency Drugs available and Suction available Patient Re-evaluated:Patient Re-evaluated prior to induction Oxygen Delivery Method: Circle system utilized Preoxygenation: Pre-oxygenation with 100% oxygen Induction Type: IV induction and Inhalational induction Ventilation: Mask ventilation without difficulty LMA: LMA inserted LMA Size: 4.0 Number of attempts: 1 Dental Injury: Teeth and Oropharynx as per pre-operative assessment

## 2017-10-04 NOTE — H&P (Signed)
Office Visit   09/27/2017 Giddings Silva, Everardo All, MD  Obstetrics and Gynecology   Postmenopausal bleeding +1 more  Dx   Surgical Consult ; Referred by Maryruth Eve, MD  Reason for Visit   Additional Documentation   Vitals:   Pulse 56    Wt 68 kg   LMP 03/01/2002   BMI 22.81 kg/m   BSA 1.81 m     More Vitals   Flowsheets:   Menstrual History,   Anthropometrics,   MEWS Score     Encounter Info:   Billing Info,   History,   Allergies,   Detailed Report     All Notes   Progress Notes by Nunzio Cobbs, MD at 09/27/2017 9:45 AM  Author: Nunzio Cobbs, MD Author Type: Physician Filed: 09/27/2017 10:19 AM  Note Status: Signed Cosign: Cosign Not Required Encounter Date: 09/27/2017  Editor: Nunzio Cobbs, MD (Physician)  Prior Versions: 1. Amado Coe, CMA (Certified Medical Assistant) at 09/24/2017 3:17 PM - Sign at close encounter    GYNECOLOGY  VISIT   HPI: 67 y.o.   Married  Caucasian  female   G2P2002 with Patient's last menstrual period was 03/01/2002.   here for surgery consult.     Has postmenopausal bleeding, thickened endometrium, and EMB showing endometrial polyp.  GYNECOLOGIC HISTORY: Patient's last menstrual period was 03/01/2002. Contraception:Postmenopausal Menopausal hormone therapy:none Last mammogram:12/07/16 BIRADS 1 negative/density b Last pap smear:09/16/17 - negative                OB History    Gravida  2   Para  2   Term  2   Preterm      AB      Living  2     SAB      TAB      Ectopic      Multiple      Live Births                     Patient Active Problem List   Diagnosis Date Noted  . Postmenopausal bleeding 09/18/2017    No past medical history on file.       Past Surgical History:  Procedure Laterality Date  . APPENDECTOMY    . CESAREAN SECTION  1986, 1989          Current Outpatient Medications   Medication Sig Dispense Refill  . Multiple Vitamin (MULTIVITAMIN) tablet Take 1 tablet by mouth daily.     No current facility-administered medications for this visit.      ALLERGIES: Patient has no known allergies.       Family History  Problem Relation Age of Onset  . Stroke Mother   . Depression Brother   . Heart disease Brother   . Breast cancer Maternal Aunt   . Ovarian cancer Paternal Grandmother     Social History        Socioeconomic History  . Marital status: Married    Spouse name: Not on file  . Number of children: Not on file  . Years of education: Not on file  . Highest education level: Not on file  Occupational History  . Not on file  Social Needs  . Financial resource strain: Not on file  . Food insecurity:    Worry: Not on file    Inability: Not on file  . Transportation needs:  Medical: Not on file    Non-medical: Not on file  Tobacco Use  . Smoking status: Never Smoker  . Smokeless tobacco: Never Used  Substance and Sexual Activity  . Alcohol use: Yes    Alcohol/week: 6.0 standard drinks    Types: 6 Standard drinks or equivalent per week    Comment: 2 glasses of wine per day  . Drug use: No  . Sexual activity: Yes    Partners: Male    Birth control/protection: Post-menopausal  Lifestyle  . Physical activity:    Days per week: Not on file    Minutes per session: Not on file  . Stress: Not on file  Relationships  . Social connections:    Talks on phone: Not on file    Gets together: Not on file    Attends religious service: Not on file    Active member of club or organization: Not on file    Attends meetings of clubs or organizations: Not on file    Relationship status: Not on file  . Intimate partner violence:    Fear of current or ex partner: Not on file    Emotionally abused: Not on file    Physically abused: Not on file    Forced sexual activity: Not on file  Other  Topics Concern  . Not on file  Social History Narrative  . Not on file    Review of Systems  Constitutional: Negative.   HENT: Negative.   Eyes: Negative.   Respiratory: Negative.   Cardiovascular: Negative.   Gastrointestinal: Negative.   Endocrine: Negative.   Genitourinary: Negative.   Musculoskeletal: Negative.   Skin: Negative.   Allergic/Immunologic: Negative.   Neurological: Negative.   Hematological: Negative.   Psychiatric/Behavioral: Negative.   All other systems reviewed and are negative.   PHYSICAL EXAMINATION:    Pulse (!) 56   Wt 150 lb (68 kg)   LMP 03/01/2002   BMI 22.81 kg/m     General appearance: alert, cooperative and appears stated age Head: Normocephalic, without obvious abnormality, atraumatic Neck: no adenopathy, supple, symmetrical, trachea midline and thyroid normal to inspection and palpation Lungs: clear to auscultation bilaterally Breasts: normal appearance, no masses or tenderness, No nipple retraction or dimpling, No nipple discharge or bleeding, No axillary or supraclavicular adenopathy Heart: regular rate and rhythm Abdomen: soft, non-tender, no masses,  no organomegaly Extremities: extremities normal, atraumatic, no cyanosis or edema Skin: Skin color, texture, turgor normal. No rashes or lesions Lymph nodes: Cervical, supraclavicular, and axillary nodes normal. No abnormal inguinal nodes palpated Neurologic: Grossly normal  Pelvic:  deferred.  Chaperone was present for exam.  ASSESSMENT  Postmenopausal bleeding.  Endometrial polyp.  PLAN  Discussion of postmenopausal bleeding and endometrial polyp. Discussion of hysteroscopy with Myosure polypectomy, dilation and curettage.  Risks, benefits, and alternatives reviewed. Risks include but are not limited to bleeding, infection, damage to surrounding organs including uterine perforation requiring hospitalization and laparoscopy, reaction to anesthesia, DVT, PE, death,  need for further treatment and surgery including repeat hysteroscopy, hysterectomy or medical therapy.   Surgical expectations and recovery discussed.  Patient wishes to proceed.   An After Visit Summary was printed and given to the patient.  __15____ minutes face to face time of which over 50% was spent in counseling.

## 2017-10-04 NOTE — Discharge Instructions (Signed)
Ms. Shorkey,   I found and removed a large polyp inside the uterine cavity.  You did well!  Josefa Half, MD  Dilation and Curettage or Vacuum Curettage, Care After These instructions give you information about caring for yourself after your procedure. Your doctor may also give you more specific instructions. Call your doctor if you have any problems or questions after your procedure. Follow these instructions at home: Activity  Do not drive or use heavy machinery while taking prescription pain medicine.  For 24 hours after your procedure, avoid driving.  Take short walks often, followed by rest periods. Ask your doctor what activities are safe for you. After one or two days, you may be able to return to your normal activities.  Do not lift anything that is heavier than 10 lb (4.5 kg) until your doctor approves.  For at least 2 weeks, or as long as told by your doctor: ? Do not douche. ? Do not use tampons. ? Do not have sex. General instructions  Take over-the-counter and prescription medicines only as told by your doctor. This is very important if you take blood thinning medicine.  Do not take baths, swim, or use a hot tub until your doctor approves. Take showers instead of baths.  Wear compression stockings as told by your doctor.  It is up to you to get the results of your procedure. Ask your doctor when your results will be ready.  Keep all follow-up visits as told by your doctor. This is important. Contact a doctor if:  You have very bad cramps that get worse or do not get better with medicine.  You have very bad pain in your belly (abdomen).  You cannot drink fluids without throwing up (vomiting).  You get pain in a different part of the area between your belly and thighs (pelvis).  You have bad-smelling discharge from your vagina.  You have a rash. Get help right away if:  You are bleeding a lot from your vagina. A lot of bleeding means soaking more than one  sanitary pad in an hour, for 2 hours in a row.  You have clumps of blood (blood clots) coming from your vagina.  You have a fever or chills.  Your belly feels very tender or hard.  You have chest pain.  You have trouble breathing.  You cough up blood.  You feel dizzy.  You feel light-headed.  You pass out (faint).  You have pain in your neck or shoulder area. Summary  Take short walks often, followed by rest periods. Ask your doctor what activities are safe for you. After one or two days, you may be able to return to your normal activities.  Do not lift anything that is heavier than 10 lb (4.5 kg) until your doctor approves.  Do not take baths, swim, or use a hot tub until your doctor approves. Take showers instead of baths.  Contact your doctor if you have any symptoms of infection, like bad-smelling discharge from your vagina. This information is not intended to replace advice given to you by your health care provider. Make sure you discuss any questions you have with your health care provider. Document Released: 10/08/2007 Document Revised: 09/16/2015 Document Reviewed: 09/16/2015 Elsevier Interactive Patient Education  2017 [place].  Hysteroscopy, Care After Refer to this sheet in the next few weeks. These instructions provide you with information on caring for yourself after your procedure. Your health care provider may also give you more specific instructions.  Your treatment has been planned according to current medical practices, but problems sometimes occur. Call your health care provider if you have any problems or questions after your procedure. What can I expect after the procedure? After your procedure, it is typical to have the following:  You may have some cramping. This normally lasts for a couple days.  You may have bleeding. This can vary from light spotting for a few days to menstrual-like bleeding for 3-7 days.  Follow these instructions at  home:  Rest for the first 1-2 days after the procedure.  Only take over-the-counter or prescription medicines as directed by your health care provider. Do not take aspirin. It can increase the chances of bleeding.  Take showers instead of baths for 2 weeks or as directed by your health care provider.  Do not drive for 24 hours or as directed.  Do not drink alcohol while taking pain medicine.  Do not use tampons, douche, or have sexual intercourse for 2 weeks or until your health care provider says it is okay.  Take your temperature twice a day for 4-5 days. Write it down each time.  Follow your health care provider's advice about diet, exercise, and lifting.  If you develop constipation, you may: ? Take a mild laxative if your health care provider approves. ? Add bran foods to your diet. ? Drink enough fluids to keep your urine clear or pale yellow.  Try to have someone with you or available to you for the first 24-48 hours, especially if you were given a general anesthetic.  Follow up with your health care provider as directed. Contact a health care provider if:  You feel dizzy or lightheaded.  You feel sick to your stomach (nauseous).  You have abnormal vaginal discharge.  You have a rash.  You have pain that is not controlled with medicine. Get help right away if:  You have bleeding that is heavier than a normal menstrual period.  You have a fever.  You have increasing cramps or pain, not controlled with medicine.  You have new belly (abdominal) pain.  You pass out.  You have pain in the tops of your shoulders (shoulder strap areas).  You have shortness of breath. This information is not intended to replace advice given to you by your health care provider. Make sure you discuss any questions you have with your health care provider. Document Released: 10/19/2012 Document Revised: 06/06/2015 Document Reviewed: 07/28/2012 Elsevier Interactive Patient Education   2017 [place] [facility] Instructions  Activity: Get plenty of rest for the remainder of the day. A responsible individual must stay with you for 24 hours following the procedure.  For the next 24 hours, DO NOT: -Drive a car -Paediatric nurse -Drink alcoholic beverages -Take any medication unless instructed by your physician -Make any legal decisions or sign important papers.  Meals: Start with liquid foods such as gelatin or soup. Progress to regular foods as tolerated. Avoid greasy, spicy, heavy foods. If nausea and/or vomiting occur, drink only clear liquids until the nausea and/or vomiting subsides. Call your physician if vomiting continues.

## 2017-10-04 NOTE — Progress Notes (Signed)
Update to History and Physical  No marked change in status since office preop visit.  Had an MRI of her right hand and has a benign cyst noted.  Patient examined.  OK to proceed with surgery.

## 2017-10-04 NOTE — Transfer of Care (Signed)
Immediate Anesthesia Transfer of Care Note  Patient: Casey Villarreal  Procedure(s) Performed: DILATATION & CURETTAGE/HYSTEROSCOPY WITH MYOSURE (Bilateral Vagina )  Patient Location: PACU  Anesthesia Type:General  Level of Consciousness: sedated  Airway & Oxygen Therapy: Patient Spontanous Breathing and Patient connected to nasal cannula oxygen  Post-op Assessment: Report given to RN  Post vital signs: Reviewed and stable  Last Vitals:  Vitals Value Taken Time  BP    Temp    Pulse    Resp    SpO2      Last Pain:  Vitals:   10/04/17 1002  TempSrc: Oral  PainSc: 0-No pain      Patients Stated Pain Goal: 3 (79/[id number])  Complications: No apparent anesthesia complications

## 2017-10-04 NOTE — Anesthesia Preprocedure Evaluation (Addendum)
Anesthesia Evaluation  Patient identified by MRN, date of birth, ID band Patient awake    Reviewed: Allergy & Precautions, NPO status , Patient's Chart, lab work & pertinent test results  Airway Mallampati: I  TM Distance: >3 FB Neck ROM: Full    Dental no notable dental hx. (+) Teeth Intact   Pulmonary neg pulmonary ROS,    Pulmonary exam normal breath sounds clear to auscultation       Cardiovascular negative cardio ROS Normal cardiovascular exam Rhythm:Regular Rate:Normal     Neuro/Psych negative neurological ROS  negative psych ROS   GI/Hepatic negative GI ROS, Neg liver ROS,   Endo/Other  negative endocrine ROS  Renal/GU negative Renal ROS  negative genitourinary   Musculoskeletal negative musculoskeletal ROS (+)   Abdominal   Peds  Hematology negative hematology ROS (+)   Anesthesia Other Findings Post-menopausal bleeding  Reproductive/Obstetrics                            Anesthesia Physical Anesthesia Plan  ASA: II  Anesthesia Plan: General   Post-op Pain Management:    Induction: Intravenous  PONV Risk Score and Plan: 3 and Dexamethasone, Ondansetron, Scopolamine patch - Pre-op and Midazolam  Airway Management Planned: LMA  Additional Equipment:   Intra-op Plan:   Post-operative Plan: Extubation in OR  Informed Consent: I have reviewed the patients History and Physical, chart, labs and discussed the procedure including the risks, benefits and alternatives for the proposed anesthesia with the patient or authorized representative who has indicated his/her understanding and acceptance.   Dental advisory given  Plan Discussed with: CRNA  Anesthesia Plan Comments:         Anesthesia Quick Evaluation

## 2017-10-04 NOTE — Op Note (Signed)
OPERATIVE REPORT   PREOPERATIVE DIAGNOSES:   Postmenopausal bleeding, endometrial polyp  POSTOPERATIVE DIAGNOSES:   Postmenopausal bleeding, endometrial polyp  PROCEDURE:  Hysteroscopy with dilation and curettage and Myosure resection of endometrial polyp  SURGEON:  Lenard Galloway, MD  ANESTHESIA:  LMA, paracervical block with 10 mL of 1% lidocaine.  IV FLUIDS:   1000 cc LR  EBL:  10 cc.   URINE OUTPUT:   200 cc  NORMAL SALINE DEFICIT:   95 cc.   COMPLICATIONS:  None.  INDICATIONS FOR THE PROCEDURE:     The patient is a 67 year old Gravida 2 85 2 Caucasian female who presents with postmenopausal bleeding.  Pelvic ultrasound showed a thickened endometrium and endometrial biopsy showed a benign polyp.  A plan is now made to proceed with a hysteroscopy with dilation and curettage and resection of the endometrial polyp after risks, benefits and alternatives were reviewed.  FINDINGS:  Exam under anesthesia revealed a small uterus.  No adnexal masses were noted.  The uterus was sounded to 6.5 cm.  Hysteroscopy showed a large polyp which filled the uterine fundus.  Endometrial currettings were scantly in amount.   SPECIMENS: The endometrial polyp and endometrial curettings were sent to Pathology separately.  PROCEDURE IN DETAIL:  The patient was reidentified in the preoperative hold area.  She received TED hose and PAS stockings for DVT prophylaxis.  In the operating room, the patient was placed in the dorsal lithotomy position and then an LMA anesthetic was introduced.  The patient's lower abdomen, vagina and perineum were sterilely prepped with Betadine and the  patient's bladder was catheterized of urine.  She was sterilly draped  An exam under anesthesia was performed.  A speculum was placed inside the vagina and a single-tooth tenaculum was placed on the anterior cervical lip.  A paracervical block was performed with a total of 10 mL of 1% lidocaine plain.  The uterus  was sounded. The cervix was dilated to a #21 Pratt dilator.  The MyoSure hysteroscope was then inserted inside the uterine cavity under the continuous infusion of normal saline solution.  Findings are as noted above.  The MyoSure hysteroscope was removed after the polyp was resected.  A sharp and a  serrated curette were introduced into the uterine cavity and the endometrium was  curetted in all 4 quadrants.  A minimal amount of endometrial curettings was obtained.   This specimen was sent to Pathology.  The single-tooth tenaculum which had been placed on the anterior cervical lip was removed.  Hemostasis was good, and all of the vaginal instruments  were removed.  The patient was awakened and escorted to the recovery room in stable condition after she was cleansed with Betadine.  There were no complications to the procedure.  All needle, instrument and sponge counts were correct.  Lenard Galloway, MD

## 2017-10-05 ENCOUNTER — Encounter (HOSPITAL_COMMUNITY): Payer: Self-pay | Admitting: Obstetrics and Gynecology

## 2017-10-14 NOTE — Progress Notes (Signed)
GYNECOLOGY  VISIT   HPI: 67 y.o.   Married  Caucasian  female   G2P2002 with Patient's last menstrual period was 03/01/2002.   here for 2 week follow up   Gap (Bilateral Vagina ). Final pathology - benign endometrial polyp.   No problems with recovery from surgery.  Having a tiny bit of bleeding.  Bleeding for 2 days after surgery.  Now spotting today.   Back from [place].   GYNECOLOGIC HISTORY: Patient's last menstrual period was 03/01/2002. Contraception:  Post menopausal Menopausal hormone therapy:  n/a Last mammogram:  12/07/2016 BI-RADS CATEGORY  1: Negative. Last pap smear:   09/16/2017 normal        OB History    Gravida  2   Para  2   Term  2   Preterm      AB      Living  2     SAB      TAB      Ectopic      Multiple      Live Births                 Patient Active Problem List   Diagnosis Date Noted  . Postmenopausal bleeding 09/18/2017    Past Medical History:  Diagnosis Date  . Family history of adverse reaction to anesthesia 1976   HALOTHANE SEVERE REACTION    Past Surgical History:  Procedure Laterality Date  . APPENDECTOMY    . [place]  . DILATATION & CURETTAGE/HYSTEROSCOPY WITH MYOSURE Bilateral 10/04/2017   Procedure: DILATATION & CURETTAGE/HYSTEROSCOPY WITH MYOSURE;  Surgeon: Nunzio Cobbs, MD;  Location: [facility];  Service: Gynecology;  Laterality: Bilateral;    Current Outpatient Medications  Medication Sig Dispense Refill  . Multiple Vitamin (MULTIVITAMIN) tablet Take 1 tablet by mouth daily.    . Naphazoline-Glycerin (REDNESS RELIEF OP) Place 1 drop into both eyes daily as needed (for redness relief).     No current facility-administered medications for this visit.      ALLERGIES: Patient has no known allergies.  Family History  Problem Relation Age of Onset  . Stroke Mother   . Depression Brother   . Heart disease Brother   . Breast cancer  Maternal Aunt   . Ovarian cancer Paternal Grandmother     Social History   Socioeconomic History  . Marital status: Married    Spouse name: Not on file  . Number of children: Not on file  . Years of education: Not on file  . Highest education level: Not on file  Occupational History  . Not on file  Social Needs  . Financial resource strain: Not on file  . Food insecurity:    Worry: Not on file    Inability: Not on file  . Transportation needs:    Medical: Not on file    Non-medical: Not on file  Tobacco Use  . Smoking status: Never Smoker  . Smokeless tobacco: Never Used  Substance and Sexual Activity  . Alcohol use: Yes    Alcohol/week: 6.0 standard drinks    Types: 6 Standard drinks or equivalent per week    Comment: 2 glasses of wine per day  . Drug use: No  . Sexual activity: Yes    Partners: Male    Birth control/protection: Post-menopausal  Lifestyle  . Physical activity:    Days per week: Not on file    Minutes per session: Not  on file  . Stress: Not on file  Relationships  . Social connections:    Talks on phone: Not on file    Gets together: Not on file    Attends religious service: Not on file    Active member of club or organization: Not on file    Attends meetings of clubs or organizations: Not on file    Relationship status: Not on file  . Intimate partner violence:    Fear of current or ex partner: Not on file    Emotionally abused: Not on file    Physically abused: Not on file    Forced sexual activity: Not on file  Other Topics Concern  . Not on file  Social History Narrative  . Not on file    Review of Systems  Constitutional: Negative.   HENT: Negative.   Eyes: Negative.   Respiratory: Negative.   Cardiovascular: Negative.   Gastrointestinal: Negative.   Endocrine: Negative.   Genitourinary: Negative.   Musculoskeletal: Negative.   Skin: Negative.   Allergic/Immunologic: Negative.   Neurological: Negative.   Hematological:  Negative.   Psychiatric/Behavioral: Negative.   All other systems reviewed and are negative.   PHYSICAL EXAMINATION:    BP 128/66 (BP Location: Right Arm, Patient Position: Sitting, Cuff Size: Normal)   Pulse (!) 56   Ht 5\' 8"  (1.727 m)   Wt 154 lb 6.4 oz (70 kg)   LMP 03/01/2002   BMI 23.48 kg/m     General appearance: alert, cooperative and appears stated age   Pelvic: External genitalia:  no lesions              Urethra:  normal appearing urethra with no masses, tenderness or lesions              Bartholins and Skenes: normal                 Vagina: normal appearing vagina with normal color and discharge, no lesions              Cervix: no lesions.  Small amount of brown blood.                Bimanual Exam:  Uterus:  normal size, contour, position, consistency, mobility, non-tender              Adnexa: no mass, fullness, tenderness       Chaperone was present for exam.  ASSESSMENT  Status post hysteroscopy with Myosure resection of polyp. Vaginal spotting.   PLAN  Surgical findings, procedure and pathology report reviewed. Questions invited and answered. Ok to start using vaginal estrogen cream after all bleeding stops.  Use 1/2 gm pv at hs twice weekly.  FU for annual exam next month.   An After Visit Summary was printed and given to the patient.

## 2017-10-18 ENCOUNTER — Encounter: Payer: Self-pay | Admitting: Obstetrics and Gynecology

## 2017-10-18 ENCOUNTER — Other Ambulatory Visit: Payer: Self-pay

## 2017-10-18 ENCOUNTER — Ambulatory Visit (INDEPENDENT_AMBULATORY_CARE_PROVIDER_SITE_OTHER): Payer: Medicare HMO | Admitting: Obstetrics and Gynecology

## 2017-10-18 VITALS — BP 128/66 | HR 56 | Ht 68.0 in | Wt 154.4 lb

## 2017-10-18 DIAGNOSIS — Z9889 Other specified postprocedural states: Secondary | ICD-10-CM

## 2017-11-13 ENCOUNTER — Other Ambulatory Visit: Payer: Self-pay | Admitting: Obstetrics & Gynecology

## 2017-11-13 MED ORDER — SULFAMETHOXAZOLE-TRIMETHOPRIM 800-160 MG PO TABS: 1.0000 | ORAL_TABLET | Freq: Two times a day (BID) | ORAL | 0 refills | Status: AC

## 2017-11-13 NOTE — Progress Notes (Signed)
Symptoms of Cystitis.  No allergy to Bactrim.  Prescription sent to pharmacy.

## 2017-11-19 ENCOUNTER — Other Ambulatory Visit: Payer: Self-pay

## 2017-11-19 ENCOUNTER — Encounter: Payer: Self-pay | Admitting: Obstetrics and Gynecology

## 2017-11-19 ENCOUNTER — Ambulatory Visit (INDEPENDENT_AMBULATORY_CARE_PROVIDER_SITE_OTHER): Payer: Medicare HMO | Admitting: Obstetrics and Gynecology

## 2017-11-19 VITALS — BP 114/62 | HR 60 | Resp 14 | Ht 67.0 in | Wt 146.0 lb

## 2017-11-19 DIAGNOSIS — Z01419 Encounter for gynecological examination (general) (routine) without abnormal findings: Secondary | ICD-10-CM

## 2017-11-19 NOTE — Progress Notes (Signed)
67 y.o. G35P2002 Married Caucasian female here for annual exam.    Had a UTI recently.  Took Bactrim DS. This followed intercourse.  Has not started her estrogen cream yet.   Taking calcium with Mg and vit D added.  PCP:  Maryruth Eve, Md   Patient's last menstrual period was 03/01/2002.           Sexually active: Yes.    The current method of family planning is post menopausal status.    Exercising: Yes.    cardio, barre and pilates Smoker:  no  Health Maintenance: Pap: 09-16-17 Neg, 11-15-15 Neg:Neg HR HPV History of abnormal Pap:  no MMG:12-07-16 Neg/density b/BiRads1.   Colonoscopy: Negative cologuard 2018 BMD:   12/2016  Result :Osteopenia TDaP:  2019.  Gardasil:   no CHE:NIDPO Hep C:11-15-15 Neg Screening Labs:  ---   reports that she has never smoked. She has never used smokeless tobacco. She reports that she drinks about 14.0 standard drinks of alcohol per week. She reports that she does not use drugs.  Past Medical History:  Diagnosis Date  . Family history of adverse reaction to anesthesia 1976   HALOTHANE SEVERE REACTION    Past Surgical History:  Procedure Laterality Date  . APPENDECTOMY    . [place]  . DILATATION & CURETTAGE/HYSTEROSCOPY WITH MYOSURE Bilateral 10/04/2017   Procedure: DILATATION & CURETTAGE/HYSTEROSCOPY WITH MYOSURE;  Surgeon: Nunzio Cobbs, MD;  Location: [facility];  Service: Gynecology;  Laterality: Bilateral;    Current Outpatient Medications  Medication Sig Dispense Refill  . Multiple Vitamin (MULTIVITAMIN) tablet Take 1 tablet by mouth daily.    . Naphazoline-Glycerin (REDNESS RELIEF OP) Place 1 drop into both eyes daily as needed (for redness relief).     No current facility-administered medications for this visit.     Family History  Problem Relation Age of Onset  . Stroke Mother   . Depression Brother   . Heart disease Brother   . Breast cancer Maternal Aunt   . Ovarian cancer Paternal  Grandmother     Review of Systems  All other systems reviewed and are negative.   Exam:   BP 114/62 (BP Location: Right Arm, Patient Position: Sitting, Cuff Size: Normal)   Pulse 60   Resp 14   Ht 5\' 7"  (1.702 m)   Wt 146 lb (66.2 kg)   LMP 03/01/2002   BMI 22.87 kg/m     General appearance: alert, cooperative and appears stated age Head: Normocephalic, without obvious abnormality, atraumatic Neck: no adenopathy, supple, symmetrical, trachea midline and thyroid normal to inspection and palpation Lungs: clear to auscultation bilaterally Breasts: normal appearance, no masses or tenderness, No nipple retraction or dimpling, No nipple discharge or bleeding, No axillary or supraclavicular adenopathy Heart: regular rate and rhythm Abdomen: soft, non-tender; no masses, no organomegaly Extremities: extremities normal, atraumatic, no cyanosis or edema Skin: Skin color, texture, turgor normal. No rashes or lesions Lymph nodes: Cervical, supraclavicular, and axillary nodes normal. No abnormal inguinal nodes palpated Neurologic: Grossly normal  Pelvic: External genitalia:  no lesions              Urethra:  normal appearing urethra with no masses, tenderness or lesions              Bartholins and Skenes: normal                 Vagina: normal appearing vagina with normal color and discharge, no lesions  Cervix: no lesions              Pap taken: No. Bimanual Exam:  Uterus:  normal size, contour, position, consistency, mobility, non-tender              Adnexa: no mass, fullness, tenderness              Rectal exam: Yes.  .  Confirms.              Anus:  normal sphincter tone, no lesions  Chaperone was present for exam.  Assessment:   Well woman visit with normal exam. Status post hysteroscopy with dilation and curettage.  Benign polyp.  UTIs.  Osteopenia.   Plan: Mammogram screening. Recommended self breast awareness. Pap and HR HPV as above. Guidelines for Calcium,  Vitamin D, regular exercise program including cardiovascular and weight bearing exercise. Flu vaccine and shingles vaccine recommended. Ok to resume vaginal estrogen cream.  1/2 gram pv 2 -3 times per week at hs.  She will call for refills if needed.  BMD next year.  Follow up annually and prn.     After visit summary provided.

## 2017-11-19 NOTE — Patient Instructions (Signed)

## 2017-11-30 DIAGNOSIS — R69 Illness, unspecified: Secondary | ICD-10-CM | POA: Diagnosis not present

## 2017-12-01 ENCOUNTER — Ambulatory Visit: Payer: BLUE CROSS/BLUE SHIELD | Admitting: Obstetrics and Gynecology

## 2017-12-14 ENCOUNTER — Other Ambulatory Visit: Payer: Self-pay | Admitting: Obstetrics and Gynecology

## 2017-12-14 DIAGNOSIS — Z1231 Encounter for screening mammogram for malignant neoplasm of breast: Secondary | ICD-10-CM

## 2017-12-26 ENCOUNTER — Telehealth: Payer: Self-pay | Admitting: Obstetrics and Gynecology

## 2017-12-26 NOTE — Telephone Encounter (Signed)
The patient called yesterday c/o UTI symptoms and requesting Bactrim. Script was sent. On review of her chart, it looks like this is the 3rd time she has been treated since August and I don't see any cultures in the system. Just FYI

## 2017-12-27 NOTE — Telephone Encounter (Signed)
Please contact patient to schedule a follow up visit with me for recurrent UTIs.  She was treated during the weekend by Dr. Talbert Nan. She has been treated for these through her PCP.  I need to review with her and evaluate for urology referral.  Cc- Dr. Talbert Nan

## 2017-12-27 NOTE — Telephone Encounter (Signed)
Spoke with patient. Patient reports symptoms have resolved. Patient declines to schedule OV at this time. Patient states she is busy at work, will return call to schedule OV.

## 2017-12-29 NOTE — Telephone Encounter (Signed)
Patient called to schedule her UTI appointment tomorrow after 1:00 pm with Dr.Silva. To triage to assist with scheduling.

## 2017-12-29 NOTE — Telephone Encounter (Signed)
Spoke with patient. Patient has completed bactrim for UTI symptoms. Patient reports "twinges" and "possible pressure" present today. Denies fever/chills, lower back pain, N/V. Patient declined OV for today, OV scheduled for 12/19 at 8am with Dr. Quincy Simmonds.   Routing to provider for final review. Patient is agreeable to disposition. Will close encounter.

## 2017-12-30 ENCOUNTER — Ambulatory Visit (INDEPENDENT_AMBULATORY_CARE_PROVIDER_SITE_OTHER): Payer: Medicare HMO | Admitting: Obstetrics and Gynecology

## 2017-12-30 ENCOUNTER — Encounter: Payer: Self-pay | Admitting: Obstetrics and Gynecology

## 2017-12-30 ENCOUNTER — Other Ambulatory Visit: Payer: Self-pay

## 2017-12-30 VITALS — BP 120/80 | HR 56 | Ht 67.0 in | Wt 150.8 lb

## 2017-12-30 DIAGNOSIS — L814 Other melanin hyperpigmentation: Secondary | ICD-10-CM | POA: Diagnosis not present

## 2017-12-30 DIAGNOSIS — L720 Epidermal cyst: Secondary | ICD-10-CM | POA: Diagnosis not present

## 2017-12-30 DIAGNOSIS — N390 Urinary tract infection, site not specified: Secondary | ICD-10-CM | POA: Insufficient documentation

## 2017-12-30 DIAGNOSIS — H1132 Conjunctival hemorrhage, left eye: Secondary | ICD-10-CM | POA: Diagnosis not present

## 2017-12-30 DIAGNOSIS — L738 Other specified follicular disorders: Secondary | ICD-10-CM | POA: Diagnosis not present

## 2017-12-30 DIAGNOSIS — R3989 Other symptoms and signs involving the genitourinary system: Secondary | ICD-10-CM

## 2017-12-30 DIAGNOSIS — D1801 Hemangioma of skin and subcutaneous tissue: Secondary | ICD-10-CM | POA: Diagnosis not present

## 2017-12-30 DIAGNOSIS — B070 Plantar wart: Secondary | ICD-10-CM | POA: Diagnosis not present

## 2017-12-30 LAB — POCT URINALYSIS DIPSTICK
Bilirubin, UA: NEGATIVE
Blood, UA: NEGATIVE
GLUCOSE UA: NEGATIVE
KETONES UA: NEGATIVE
Leukocytes, UA: NEGATIVE
Nitrite, UA: NEGATIVE
PROTEIN UA: NEGATIVE
Urobilinogen, UA: 0.2 E.U./dL
pH, UA: 5 (ref 5.0–8.0)

## 2017-12-30 NOTE — Progress Notes (Signed)
GYNECOLOGY  VISIT   HPI: 67 y.o.   Married  Caucasian  female   G2P2002 with Patient's last menstrual period was 03/01/2002.here for pressure with urination.  She has recently completed Bactrim for UTI which was called in last Saturday.  Has urethral/bladder pressure since jan or Feb 2018.  Initially treated for yeast infection by PCP.  Has been treated for multiple UTIs by her PCP.  Her recent symptoms started 5 days ago following intercourse.  States her urine starts to smell different.  No vaginal discharge.   Was treated over the weekend with Bactrim, and her symptoms improved.  Denies any significant pain.  States she is not sure if she is being neurotic about the pressure.   She had a hysteroscopy with dilation and curettage earlier this fall for an endometrial polyp which was benign.  She denies vaginal bleeding.   Using vaginal estrogen cream 1/2 gram at night for about 2 weeks.   Drinks 2 liters of water per day.  This is long standing practice.  Urine Dip:Neg   GYNECOLOGIC HISTORY: Patient's last menstrual period was 03/01/2002. Contraception:  Postmenopausal Menopausal hormone therapy: none Last mammogram: 12-07-16 Neg/density b/BiRads1 Last pap smear:  09-16-17 Neg, 11-15-15 Neg:Neg HR HPV        OB History    Gravida  2   Para  2   Term  2   Preterm      AB      Living  2     SAB      TAB      Ectopic      Multiple      Live Births                 Patient Active Problem List   Diagnosis Date Noted  . Postmenopausal bleeding 09/18/2017    Past Medical History:  Diagnosis Date  . Family history of adverse reaction to anesthesia 1976   [place] SEVERE REACTION    Past Surgical History:  Procedure Laterality Date  . APPENDECTOMY    . [place]  . DILATATION & CURETTAGE/HYSTEROSCOPY WITH MYOSURE Bilateral 10/04/2017   Procedure: DILATATION & CURETTAGE/HYSTEROSCOPY WITH MYOSURE;  Surgeon: Nunzio Cobbs,  MD;  Location: [facility];  Service: Gynecology;  Laterality: Bilateral;    Current Outpatient Medications  Medication Sig Dispense Refill  . conjugated estrogens (PREMARIN) vaginal cream Place 1 Applicatorful vaginally daily.    . Multiple Vitamin (MULTIVITAMIN) tablet Take 1 tablet by mouth daily.    . Naphazoline-Glycerin (REDNESS RELIEF OP) Place 1 drop into both eyes daily as needed (for redness relief).     No current facility-administered medications for this visit.      ALLERGIES: Patient has no known allergies.  Family History  Problem Relation Age of Onset  . Stroke Mother   . Depression Brother   . Heart disease Brother   . Breast cancer Maternal Aunt   . Ovarian cancer Paternal Grandmother     Social History   Socioeconomic History  . Marital status: Married    Spouse name: Not on file  . Number of children: Not on file  . Years of education: Not on file  . Highest education level: Not on file  Occupational History  . Not on file  Social Needs  . Financial resource strain: Not on file  . Food insecurity:    Worry: Not on file    Inability: Not on file  .  Transportation needs:    Medical: Not on file    Non-medical: Not on file  Tobacco Use  . Smoking status: Never Smoker  . Smokeless tobacco: Never Used  Substance and Sexual Activity  . Alcohol use: Yes    Alcohol/week: 14.0 standard drinks    Types: 14 Glasses of wine per week    Comment: 2 glasses of wine per day  . Drug use: No  . Sexual activity: Yes    Partners: Male    Birth control/protection: Post-menopausal  Lifestyle  . Physical activity:    Days per week: Not on file    Minutes per session: Not on file  . Stress: Not on file  Relationships  . Social connections:    Talks on phone: Not on file    Gets together: Not on file    Attends religious service: Not on file    Active member of club or organization: Not on file    Attends meetings of clubs or organizations: Not on file     Relationship status: Not on file  . Intimate partner violence:    Fear of current or ex partner: Not on file    Emotionally abused: Not on file    Physically abused: Not on file    Forced sexual activity: Not on file  Other Topics Concern  . Not on file  Social History Narrative  . Not on file    Review of Systems  Genitourinary:       Lower pressure with urinating  All other systems reviewed and are negative.   PHYSICAL EXAMINATION:    BP 120/80 (BP Location: Right Arm, Patient Position: Sitting, Cuff Size: Normal)   Pulse (!) 56   Ht 5\' 7"  (1.702 m)   Wt 150 lb 12.8 oz (68.4 kg)   LMP 03/01/2002   BMI 23.62 kg/m     General appearance: alert, cooperative and appears stated age    Pelvic: External genitalia:  no lesions              Urethra:  normal appearing urethra with no masses, tenderness or lesions              Bartholins and Skenes: normal                 Vagina: normal appearing vagina with normal color and discharge, no lesions              Cervix: no lesions                Bimanual Exam:  Uterus:  normal size, contour, position, consistency, mobility, non-tender              Adnexa: no mass, fullness, tenderness            Examined in lithotomy and standing position.  No significant prolapse noted.  Chaperone was present for exam.  ASSESSMENT  Bladder pressure.  Urethral pressure.  Recurrent UTI.  Recent tx with Bactrim by phone during weekend hours.  PLAN  Urine micro/cx.  No abx given.  Reduce vaginal estrogen to 1/2 gm pv at hs 2 -3 times per week.  Will refer to Dr. Bjorn Loser for urologic evaluation.  May need post coital tx for UTI prevention.    An After Visit Summary was printed and given to the patient.  __25____ minutes face to face time of which over 50% was spent in counseling.

## 2017-12-30 NOTE — Addendum Note (Signed)
Addended by: Lowella Fairy on: 12/30/2017 02:04 PM   Modules accepted: Orders

## 2017-12-31 LAB — URINALYSIS, MICROSCOPIC ONLY
Bacteria, UA: NONE SEEN
Casts: NONE SEEN /lpf

## 2017-12-31 LAB — URINE CULTURE

## 2018-01-19 ENCOUNTER — Telehealth: Payer: Self-pay | Admitting: Obstetrics and Gynecology

## 2018-01-19 NOTE — Telephone Encounter (Signed)
Per Dr. Quincy Simmonds- Madaline Brilliant to close referral.

## 2018-01-19 NOTE — Telephone Encounter (Signed)
Dr. Quincy Simmonds,  Patient has declined the referral to [facility] she states she is not having any pain or pressure any more. Okay to close the referral?  [facility]

## 2018-04-22 ENCOUNTER — Ambulatory Visit: Payer: Medicare HMO

## 2018-07-12 ENCOUNTER — Other Ambulatory Visit: Payer: Self-pay

## 2018-07-12 ENCOUNTER — Ambulatory Visit
Admission: RE | Admit: 2018-07-12 | Discharge: 2018-07-12 | Disposition: A | Discharge status: Home / Self Care | Payer: Medicare HMO | Source: Ambulatory Visit | Attending: Obstetrics and Gynecology | Admitting: Obstetrics and Gynecology

## 2018-07-12 DIAGNOSIS — Z1231 Encounter for screening mammogram for malignant neoplasm of breast: Secondary | ICD-10-CM

## 2018-11-23 ENCOUNTER — Ambulatory Visit: Payer: Medicare HMO | Admitting: Obstetrics and Gynecology

## 2018-12-26 ENCOUNTER — Encounter: Payer: Self-pay | Admitting: Obstetrics and Gynecology

## 2018-12-26 ENCOUNTER — Other Ambulatory Visit: Payer: Self-pay | Admitting: Obstetrics and Gynecology

## 2018-12-26 ENCOUNTER — Other Ambulatory Visit: Payer: Self-pay

## 2018-12-26 ENCOUNTER — Ambulatory Visit (INDEPENDENT_AMBULATORY_CARE_PROVIDER_SITE_OTHER): Payer: Medicare HMO | Admitting: Obstetrics and Gynecology

## 2018-12-26 VITALS — BP 110/76 | HR 50 | Temp 97.3°F | Resp 14 | Ht 67.5 in | Wt 150.0 lb

## 2018-12-26 DIAGNOSIS — Z01419 Encounter for gynecological examination (general) (routine) without abnormal findings: Secondary | ICD-10-CM

## 2018-12-26 DIAGNOSIS — E2839 Other primary ovarian failure: Secondary | ICD-10-CM

## 2018-12-26 DIAGNOSIS — Z780 Asymptomatic menopausal state: Secondary | ICD-10-CM

## 2018-12-26 NOTE — Patient Instructions (Signed)

## 2018-12-26 NOTE — Progress Notes (Signed)
Copy of Medicare ABN for well woman visit given to patient.

## 2018-12-26 NOTE — Progress Notes (Signed)
67 y.o. G43P2002 Married Caucasian female here for annual exam.    New grand-daughter 2 months old.  Has rosacea.  Tolerating doxycycline.   Bladder is back to normal.   No vaginal bleeding or spotting.  No pelvic pressure.  Occasional ping in her lower abdomen in the area of her ovaries.   Not using vaginal estrogen cream.  Does not need a refill.   PCP: Maryruth Eve, MD  Patient's last menstrual period was 03/01/2002.           Sexually active: Yes.    The current method of family planning is post menopausal status.    Exercising: Yes.    cardio, BAR, pilates Smoker:  no  Health Maintenance: Pap:  09-16-17 Neg, 11-15-15 Neg:Neg HR HPV, 10-21-12 Neg:Neg HR HPV History of abnormal Pap:  no MMG: 07-12-18 3D/Neg/density B/BiRads1 Colonoscopy: 2018 Neg cologuard, ~2010 neg.colonoscopy. BMD: 12/2016  Result :Osteopenia TDaP:  2019 Gardasil:   no HIV: never Hep C: 11-15-15 Neg Screening Labs:  PCP.  Flu vaccine:  Completed.    reports that she has never smoked. She has never used smokeless tobacco. She reports current alcohol use of about 14.0 standard drinks of alcohol per week. She reports that she does not use drugs.  Past Medical History:  Diagnosis Date  . Family history of adverse reaction to anesthesia 1976   [place] SEVERE REACTION    Past Surgical History:  Procedure Laterality Date  . APPENDECTOMY    . [place]  . DILATATION & CURETTAGE/HYSTEROSCOPY WITH MYOSURE Bilateral 10/04/2017   Procedure: DILATATION & CURETTAGE/HYSTEROSCOPY WITH MYOSURE;  Surgeon: Nunzio Cobbs, MD;  Location: [facility];  Service: Gynecology;  Laterality: Bilateral;    Current Outpatient Medications  Medication Sig Dispense Refill  . doxycycline (VIBRAMYCIN) 100 MG capsule Take 100 mg by mouth 2 (two) times daily.    . metroNIDAZOLE (METROGEL) 1 % gel Apply 1 application topically as needed.    . Multiple Vitamin (MULTIVITAMIN) tablet Take 1 tablet by  mouth daily.    . Naphazoline-Glycerin (REDNESS RELIEF OP) Place 1 drop into both eyes daily as needed (for redness relief).    . conjugated estrogens (PREMARIN) vaginal cream Place 1 Applicatorful vaginally daily.     No current facility-administered medications for this visit.    Family History  Problem Relation Age of Onset  . Stroke Mother   . Depression Brother   . Heart disease Brother   . Breast cancer Maternal Aunt   . Ovarian cancer Paternal Grandmother     Review of Systems  All other systems reviewed and are negative.   Exam:   BP 110/76   Pulse (!) 50   Temp (!) 97.3 F (36.3 C) (Temporal)   Resp 14   Ht 5' 7.5" (1.715 m)   Wt 150 lb (68 kg)   LMP 03/01/2002   BMI 23.15 kg/m     General appearance: alert, cooperative and appears stated age Head: normocephalic, without obvious abnormality, atraumatic Neck: no adenopathy, supple, symmetrical, trachea midline and thyroid normal to inspection and palpation Lungs: clear to auscultation bilaterally Breasts: normal appearance, no masses or tenderness, No nipple retraction or dimpling, No nipple discharge or bleeding, No axillary adenopathy Heart: regular rate and rhythm Abdomen: soft, non-tender; no masses, no organomegaly Extremities: extremities normal, atraumatic, no cyanosis or edema Skin: skin color, texture, turgor normal. No rashes or lesions Lymph nodes: cervical, supraclavicular, and axillary nodes normal. Neurologic: grossly normal  Pelvic:  External genitalia:  no lesions              No abnormal inguinal nodes palpated.              Urethra:  normal appearing urethra with no masses, tenderness or lesions              Bartholins and Skenes: normal                 Vagina: normal appearing vagina with normal color and discharge, no lesions              Cervix: no lesions              Pap taken: No. Bimanual Exam:  Uterus:  normal size, contour, position, consistency, mobility, non-tender               Adnexa: no mass, fullness, tenderness              Rectal exam: Yes.  .  Confirms.              Anus:  normal sphincter tone, no lesions  Chaperone was present for exam.  Assessment:   Well woman visit with normal exam. Status post hysteroscopy with dilation and curettage.  Benign polyp.  UTIs. Resolved.  Osteopenia.   Plan: Mammogram screening discussed. Pap and HR HPV as above. Guidelines for Calcium, Vitamin D, regular exercise program including cardiovascular and weight bearing exercise. BMD ordered.  She may do at the time of her mammogram. Follow up annually and prn.   After visit summary provided.

## 2019-02-06 NOTE — Progress Notes (Signed)
Recall cologuard letter mailed to patient

## 2019-11-15 ENCOUNTER — Other Ambulatory Visit: Payer: Self-pay | Admitting: Obstetrics and Gynecology

## 2019-11-15 DIAGNOSIS — Z1231 Encounter for screening mammogram for malignant neoplasm of breast: Secondary | ICD-10-CM

## 2019-11-15 DIAGNOSIS — E2839 Other primary ovarian failure: Secondary | ICD-10-CM

## 2019-12-25 ENCOUNTER — Other Ambulatory Visit: Payer: Self-pay

## 2019-12-25 ENCOUNTER — Ambulatory Visit
Admission: RE | Admit: 2019-12-25 | Discharge: 2019-12-25 | Disposition: A | Discharge status: Home / Self Care | Payer: Medicare HMO | Source: Ambulatory Visit | Attending: Obstetrics and Gynecology | Admitting: Obstetrics and Gynecology

## 2019-12-25 DIAGNOSIS — Z1231 Encounter for screening mammogram for malignant neoplasm of breast: Secondary | ICD-10-CM

## 2019-12-27 NOTE — Progress Notes (Deleted)
69 y.o. G4P2002 Married Caucasian female here for annual exam.    PCP:     Patient's last menstrual period was 03/01/2002.           Sexually active: {yes no:314532}  The current method of family planning is post menopausal status.    Exercising: {yes no:314532}  {types:19826} Smoker:  no  Health Maintenance: Pap:  09-16-17 Neg, 11-15-15 Neg:Neg HR HPV, 10-21-12 Neg:Neg HR HPV History of abnormal Pap:  no MMG: ***12-25-19 3D Colonoscopy:  ***2017 Neg cologuard BMD: 12/2016  Result :Osteopenia TDaP: 2019 Gardasil:   no KGY:JEHUD Hep C:11-15-15 Neg Screening Labs:  Hb today: ***, Urine today: ***   reports that she has never smoked. She has never used smokeless tobacco. She reports current alcohol use of about 14.0 standard drinks of alcohol per week. She reports that she does not use drugs.  Past Medical History:  Diagnosis Date  . Family history of adverse reaction to anesthesia 1976   HALOTHANE SEVERE REACTION    Past Surgical History:  Procedure Laterality Date  . APPENDECTOMY    . [place]  . DILATATION & CURETTAGE/HYSTEROSCOPY WITH MYOSURE Bilateral 10/04/2017   Procedure: DILATATION & CURETTAGE/HYSTEROSCOPY WITH MYOSURE;  Surgeon: Nunzio Cobbs, MD;  Location: [facility];  Service: Gynecology;  Laterality: Bilateral;    Current Outpatient Medications  Medication Sig Dispense Refill  . conjugated estrogens (PREMARIN) vaginal cream Place 1 Applicatorful vaginally daily.    Mar[place] Kitchen doxycycline (VIBRAMYCIN) 100 MG capsule Take 100 mg by mouth 2 (two) times daily.    . metroNIDAZOLE (METROGEL) 1 % gel Apply 1 application topically as needed.    . Multiple Vitamin (MULTIVITAMIN) tablet Take 1 tablet by mouth daily.    . Naphazoline-Glycerin (REDNESS RELIEF OP) Place 1 drop into both eyes daily as needed (for redness relief).     No current facility-administered medications for this visit.    Family History  Problem Relation Age of Onset  . Stroke  Mother   . Depression Brother   . Heart disease Brother   . Breast cancer Maternal Aunt   . Ovarian cancer Paternal Grandmother     Review of Systems  Exam:   LMP 03/01/2002     General appearance: alert, cooperative and appears stated age Head: normocephalic, without obvious abnormality, atraumatic Neck: no adenopathy, supple, symmetrical, trachea midline and thyroid normal to inspection and palpation Lungs: clear to auscultation bilaterally Breasts: normal appearance, no masses or tenderness, No nipple retraction or dimpling, No nipple discharge or bleeding, No axillary adenopathy Heart: regular rate and rhythm Abdomen: soft, non-tender; no masses, no organomegaly Extremities: extremities normal, atraumatic, no cyanosis or edema Skin: skin color, texture, turgor normal. No rashes or lesions Lymph nodes: cervical, supraclavicular, and axillary nodes normal. Neurologic: grossly normal  Pelvic: External genitalia:  no lesions              No abnormal inguinal nodes palpated.              Urethra:  normal appearing urethra with no masses, tenderness or lesions              Bartholins and Skenes: normal                 Vagina: normal appearing vagina with normal color and discharge, no lesions              Cervix: no lesions  Pap taken: {yes no:314532} Bimanual Exam:  Uterus:  normal size, contour, position, consistency, mobility, non-tender              Adnexa: no mass, fullness, tenderness              Rectal exam: {yes no:314532}.  Confirms.              Anus:  normal sphincter tone, no lesions  Chaperone was present for exam.  Assessment:   Well woman visit with normal exam.   Plan: Mammogram screening discussed. Self breast awareness reviewed. Pap and HR HPV as above. Guidelines for Calcium, Vitamin D, regular exercise program including cardiovascular and weight bearing exercise.   Follow up annually and prn.   Additional counseling given.  {yes  [id number]. _______ minutes face to face time of which over 50% was spent in counseling.    After visit summary provided.

## 2019-12-28 ENCOUNTER — Ambulatory Visit (INDEPENDENT_AMBULATORY_CARE_PROVIDER_SITE_OTHER): Payer: Medicare HMO | Admitting: Obstetrics and Gynecology

## 2019-12-28 ENCOUNTER — Other Ambulatory Visit (HOSPITAL_COMMUNITY)
Admission: RE | Admit: 2019-12-28 | Discharge: 2019-12-28 | Disposition: A | Discharge status: Home / Self Care | Payer: Medicare HMO | Source: Ambulatory Visit | Attending: Obstetrics and Gynecology | Admitting: Obstetrics and Gynecology

## 2019-12-28 ENCOUNTER — Other Ambulatory Visit: Payer: Self-pay

## 2019-12-28 ENCOUNTER — Encounter: Payer: Self-pay | Admitting: Obstetrics and Gynecology

## 2019-12-28 VITALS — BP 100/60 | HR 54 | Ht 67.0 in | Wt 153.0 lb

## 2019-12-28 DIAGNOSIS — Z124 Encounter for screening for malignant neoplasm of cervix: Secondary | ICD-10-CM | POA: Diagnosis not present

## 2019-12-28 DIAGNOSIS — M8580 Other specified disorders of bone density and structure, unspecified site: Secondary | ICD-10-CM

## 2019-12-28 NOTE — Patient Instructions (Signed)
Osteopenia  Osteopenia is a loss of thickness (density) inside of the bones. Another name for osteopenia is low bone mass. Mild osteopenia is a normal part of aging. It is not a disease, and it does not cause symptoms. However, if you have osteopenia and continue to lose bone mass, you could develop a condition that causes the bones to become thin and break more easily (osteoporosis). You may also lose some height, have back pain, and have a stooped posture. Although osteopenia is not a disease, making changes to your lifestyle and diet can help to prevent osteopenia from developing into osteoporosis. What are the causes? Osteopenia is caused by loss of calcium in the bones.  Bones are constantly changing. Old bone cells are continually being replaced with new bone cells. This process builds new bone. The mineral calcium is needed to build new bone and maintain bone density. Bone density is usually highest around age 35. After that, most people's bodies cannot replace all the bone they have lost with new bone. What increases the risk? You are more likely to develop this condition if:  You are older than age 50.  You are a woman who went through menopause early.  You have a long illness that keeps you in bed.  You do not get enough exercise.  You lack certain nutrients (malnutrition).  You have an overactive thyroid gland (hyperthyroidism).  You smoke.  You drink a lot of alcohol.  You are taking medicines that weaken the bones, such as steroids. What are the signs or symptoms? This condition does not cause any symptoms. You may have a slightly higher risk for bone breaks (fractures), so getting fractures more easily than normal may be an indication of osteopenia. How is this diagnosed? Your health care provider can diagnose this condition with a special type of X-ray exam that measures bone density (dual-energy X-ray absorptiometry, DEXA). This test can measure bone density in your  hips, spine, and wrists. Osteopenia has no symptoms, so this condition is usually diagnosed after a routine bone density screening test is done for osteoporosis. This routine screening is usually done for:  Women who are age 65 or older.  Men who are age 70 or older. If you have risk factors for osteopenia, you may have the screening test at an earlier age. How is this treated? Making dietary and lifestyle changes can lower your risk for osteoporosis. If you have severe osteopenia that is close to becoming osteoporosis, your health care provider may prescribe medicines and dietary supplements such as calcium and vitamin D. These supplements help to rebuild bone density. Follow these instructions at home:   Take over-the-counter and prescription medicines only as told by your health care provider. These include vitamins and supplements.  Eat a diet that is high in calcium and vitamin D. ? Calcium is found in dairy products, beans, salmon, and leafy green vegetables like spinach and broccoli. ? Look for foods that have vitamin D and calcium added to them (fortified foods), such as orange juice, cereal, and bread.  Do 30 or more minutes of a weight-bearing exercise every day, such as walking, jogging, or playing a sport. These types of exercises strengthen the bones.  Take precautions at home to lower your risk of falling, such as: ? Keeping rooms well-lit and free of clutter, such as cords. ? Installing safety rails on stairs. ? Using rubber mats in the bathroom or other areas that are often wet or slippery.  Do not use   any products that contain nicotine or tobacco, such as cigarettes and e-cigarettes. If you need help quitting, ask your health care provider.  Avoid alcohol or limit alcohol intake to no more than 1 drink a day for nonpregnant women and 2 drinks a day for men. One drink equals 12 oz of beer, 5 oz of wine, or 1 oz of hard liquor.  Keep all follow-up visits as told by your  health care provider. This is important. Contact a health care provider if:  You have not had a bone density screening for osteoporosis and you are: ? A woman, age 6 or older. ? A man, age 77 or older.  You are a postmenopausal woman who has not had a bone density screening for osteoporosis.  You are older than age 21 and you want to know if you should have bone density screening for osteoporosis. Summary  Osteopenia is a loss of thickness (density) inside of the bones. Another name for osteopenia is low bone mass.  Osteopenia is not a disease, but it may increase your risk for a condition that causes the bones to become thin and break more easily (osteoporosis).  You may be at risk for osteopenia if you are older than age 38 or if you are a woman who went through early menopause.  Osteopenia does not cause any symptoms, but it can be diagnosed with a bone density screening test.  Dietary and lifestyle changes are the first treatment for osteopenia. These may lower your risk for osteoporosis. This information is not intended to replace advice given to you by your health care provider. Make sure you discuss any questions you have with your health care provider. Document Revised: 12/11/2016 Document Reviewed: 10/07/2016 Elsevier Patient Education  2020 [place]. Bone Density Test The bone density test uses a special type of X-ray to measure the amount of calcium and other minerals in your bones. It can measure bone density in the hip and the spine. The test procedure is similar to having a regular X-ray. This test may also be called:  Bone densitometry.  Bone mineral density test.  Dual-energy X-ray absorptiometry (DEXA). You may have this test to:  Diagnose a condition that causes weak or thin bones (osteoporosis).  Screen you for osteoporosis.  Predict your risk for a broken bone (fracture).  Determine how well your osteoporosis treatment is working. Tell a health care  provider about:  Any allergies you have.  All medicines you are taking, including vitamins, herbs, eye drops, creams, and over-the-counter medicines.  Any problems you or family members have had with anesthetic medicines.  Any blood disorders you have.  Any surgeries you have had.  Any medical conditions you have.  Whether you are pregnant or may be pregnant.  Any medical tests you have had within the past 14 days that used contrast material. What are the risks? Generally, this is a safe procedure. However, it does expose you to a small amount of radiation, which can slightly increase your cancer risk. What happens before the procedure?  Do not take any calcium supplements starting 24 hours before your test.  Remove all metal jewelry, eyeglasses, dental appliances, and any other metal objects. What happens during the procedure?   You will lie down on an exam table. There will be an X-ray generator below you and an imaging device above you.  Other devices, such as boxes or braces, may be used to position your body properly for the scan.  The machine  will slowly scan your body. You will need to keep still.  The images will show up on a screen in the room. Images will be examined by a specialist after your test is done. The procedure may vary among health care providers and hospitals. What happens after the procedure?  It is up to you to get your test results. Ask your health care provider, or the department that is doing the test, when your results will be ready. Summary  A bone density test is an imaging test that uses a type of X-ray to measure the amount of calcium and other minerals in your bones.  The test may be used to diagnose or screen you for a condition that causes weak or thin bones (osteoporosis), predict your risk for a broken bone (fracture), or determine how well your osteoporosis treatment is working.  Do not take any calcium supplements starting 24 hours  before your test.  Ask your health care provider, or the department that is doing the test, when your results will be ready. This information is not intended to replace advice given to you by your health care provider. Make sure you discuss any questions you have with your health care provider. Document Revised: 01/14/2017 Document Reviewed: 11/02/2016 Elsevier Patient Education  [place].

## 2019-12-28 NOTE — Progress Notes (Signed)
GYNECOLOGY  VISIT   HPI: 69 y.o.   Married  Caucasian  female   G2P2002 with Patient's last menstrual period was 03/01/2002.   here for office visit for breast, pelvic and pap smear.    No vaginal bleeding.  No UTIs.   Doing her bone density in February.  She has osteopenia.  Working out.   Working is going well.   Will see a new PCP in February.   Received her Covid booster and flu vaccine.   GYNECOLOGIC HISTORY: Patient's last menstrual period was 03/01/2002. Contraception:  PMP Menopausal hormone therapy: None Last mammogram: 12-25-19 pend; 07-12-18 Neg/density B/BiRads1 Last pap smear: 09-16-17 Neg, 11-15-15 Neg:Neg HR HPV, 10-21-12 Neg:Neg HR HPV        OB History    Gravida  2   Para  2   Term  2   Preterm      AB      Living  2     SAB      IAB      Ectopic      Multiple      Live Births                 Patient Active Problem List   Diagnosis Date Noted  . Recurrent UTI 12/30/2017  . Sensation of pressure in bladder area 12/30/2017  . Postmenopausal bleeding 09/18/2017    Past Medical History:  Diagnosis Date  . Family history of adverse reaction to anesthesia 1976   [place] SEVERE REACTION    Past Surgical History:  Procedure Laterality Date  . APPENDECTOMY    . [place]  . DILATATION & CURETTAGE/HYSTEROSCOPY WITH MYOSURE Bilateral 10/04/2017   Procedure: DILATATION & CURETTAGE/HYSTEROSCOPY WITH MYOSURE;  Surgeon: Nunzio Cobbs, MD;  Location: [facility];  Service: Gynecology;  Laterality: Bilateral;    Current Outpatient Medications  Medication Sig Dispense Refill  . Multiple Vitamin (MULTIVITAMIN) tablet Take 1 tablet by mouth daily.    Marland Kitchen conjugated estrogens (PREMARIN) vaginal cream Place 1 Applicatorful vaginally daily. (Patient not taking: Reported on 12/28/2019)     No current facility-administered medications for this visit.     ALLERGIES: Patient has no known allergies.  Family History   Problem Relation Age of Onset  . Stroke Mother   . Depression Brother   . Heart disease Brother   . Breast cancer Maternal Aunt   . Ovarian cancer Paternal Grandmother     Social History   Socioeconomic History  . Marital status: Married    Spouse name: Not on file  . Number of children: Not on file  . Years of education: Not on file  . Highest education level: Not on file  Occupational History  . Not on file  Tobacco Use  . Smoking status: Never Smoker  . Smokeless tobacco: Never Used  Vaping Use  . Vaping Use: Never used  Substance and Sexual Activity  . Alcohol use: Yes    Alcohol/week: 14.0 standard drinks    Types: 14 Glasses of wine per week    Comment: 2 glasses of wine per day  . Drug use: No  . Sexual activity: Yes    Partners: Male    Birth control/protection: Post-menopausal  Other Topics Concern  . Not on file  Social History Narrative  . Not on file   Social Determinants of Health   Financial Resource Strain: Not on file  Food Insecurity: Not on file  Transportation Needs:  Not on file  Physical Activity: Not on file  Stress: Not on file  Social Connections: Not on file  Intimate Partner Violence: Not on file    Review of Systems  All other systems reviewed and are negative.   PHYSICAL EXAMINATION:    BP 100/60   Pulse (!) 54   Ht 5\' 7"  (1.702 m)   Wt 153 lb (69.4 kg)   LMP 03/01/2002   SpO2 98%   BMI 23.96 kg/m     General appearance: alert, cooperative and appears stated age   Breasts: normal appearance, no masses or tenderness, No nipple retraction or dimpling, No nipple discharge or bleeding, No axillary or supraclavicular adenopathy   Pelvic: External genitalia:  no lesions              Urethra:  normal appearing urethra with no masses, tenderness or lesions              Bartholins and Skenes: normal                 Vagina: normal appearing vagina with normal color and discharge, no lesions              Cervix: no lesions                 Bimanual Exam:  Uterus:  normal size, contour, position, consistency, mobility, non-tender              Adnexa: no mass, fullness, tenderness              Rectal exam: Yes.  .  Confirms.              Anus:  normal sphincter tone, no lesions  Chaperone was present for exam.  ASSESSMENT  Normal breast and pelvic exam. Cervical cancer screening.  Osteopenia.  PLAN  Pap with reflex ASCUS.  SBE encouraged.  FU mammogram.  BMD in Feb.   I discussed osteopenia, osteoporosis. Daily calcium and vitamin D and weight bearing exercise recommended.  Fu prn.   26 min  total time was spent for this patient encounter, including preparation, face-to-face counseling with the patient, coordination of care, and documentation of the encounter.

## 2020-01-01 LAB — CYTOLOGY - PAP: Diagnosis: NEGATIVE

## 2020-03-01 ENCOUNTER — Other Ambulatory Visit: Payer: Medicare HMO

## 2020-06-24 ENCOUNTER — Encounter: Payer: Self-pay | Admitting: Obstetrics and Gynecology

## 2020-06-24 ENCOUNTER — Ambulatory Visit
Admission: RE | Admit: 2020-06-24 | Discharge: 2020-06-24 | Disposition: A | Discharge status: Home / Self Care | Payer: Medicare HMO | Source: Ambulatory Visit | Attending: Obstetrics and Gynecology | Admitting: Obstetrics and Gynecology

## 2020-06-24 ENCOUNTER — Other Ambulatory Visit: Payer: Self-pay

## 2020-06-24 DIAGNOSIS — E2839 Other primary ovarian failure: Secondary | ICD-10-CM

## 2021-02-24 ENCOUNTER — Other Ambulatory Visit: Payer: Self-pay | Admitting: Obstetrics and Gynecology

## 2021-02-24 DIAGNOSIS — Z1231 Encounter for screening mammogram for malignant neoplasm of breast: Secondary | ICD-10-CM

## 2021-02-25 ENCOUNTER — Ambulatory Visit
Admission: RE | Admit: 2021-02-25 | Discharge: 2021-02-25 | Disposition: A | Discharge status: Home / Self Care | Payer: Medicare HMO | Source: Ambulatory Visit | Attending: Obstetrics and Gynecology | Admitting: Obstetrics and Gynecology

## 2021-02-25 DIAGNOSIS — Z1231 Encounter for screening mammogram for malignant neoplasm of breast: Secondary | ICD-10-CM

## 2021-03-25 LAB — COLOGUARD: COLOGUARD: POSITIVE — AB

## 2022-05-13 ENCOUNTER — Other Ambulatory Visit: Payer: Self-pay

## 2022-05-13 DIAGNOSIS — Z1231 Encounter for screening mammogram for malignant neoplasm of breast: Secondary | ICD-10-CM

## 2022-06-19 ENCOUNTER — Ambulatory Visit
Admission: RE | Admit: 2022-06-19 | Discharge: 2022-06-19 | Disposition: A | Discharge status: Home / Self Care | Payer: Medicare HMO | Source: Ambulatory Visit | Attending: Obstetrics and Gynecology | Admitting: Obstetrics and Gynecology

## 2022-06-19 DIAGNOSIS — Z1231 Encounter for screening mammogram for malignant neoplasm of breast: Secondary | ICD-10-CM

## 2023-08-13 ENCOUNTER — Other Ambulatory Visit: Payer: Self-pay | Admitting: Obstetrics and Gynecology

## 2023-08-13 DIAGNOSIS — Z1231 Encounter for screening mammogram for malignant neoplasm of breast: Secondary | ICD-10-CM

## 2023-08-31 ENCOUNTER — Ambulatory Visit
Admission: RE | Admit: 2023-08-31 | Discharge: 2023-08-31 | Disposition: A | Discharge status: Home / Self Care | Source: Ambulatory Visit | Attending: Obstetrics and Gynecology | Admitting: Obstetrics and Gynecology

## 2023-08-31 DIAGNOSIS — Z1231 Encounter for screening mammogram for malignant neoplasm of breast: Secondary | ICD-10-CM

## 2023-09-04 ENCOUNTER — Ambulatory Visit: Payer: Self-pay | Admitting: Obstetrics and Gynecology

## 2023-09-28 IMAGING — MG MM DIGITAL SCREENING BILAT W/ TOMO AND CAD
8 series · 9 of 24 positions shown · non-contrast
Comparison: Previous exam(s).

CLINICAL DATA: Screening.

EXAM:
DIGITAL SCREENING BILATERAL MAMMOGRAM WITH TOMOSYNTHESIS AND CAD
TECHNIQUE: Bilateral screening digital craniocaudal and mediolateral oblique
mammograms were obtained. Bilateral screening digital breast
tomosynthesis was performed. The images were evaluated with
computer-aided detection.

[R CC synth-2D]
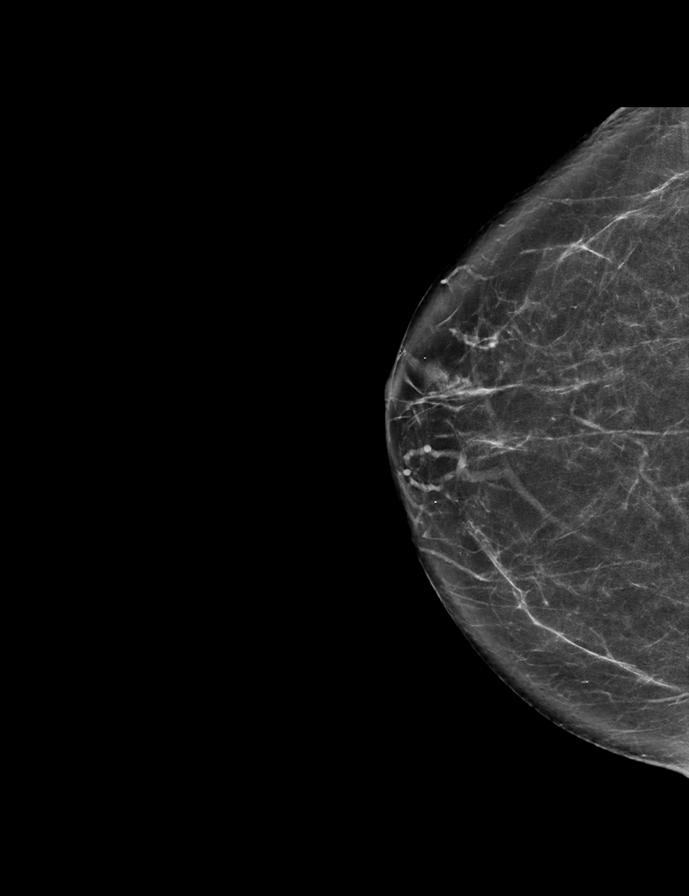

[L MLO synth-2D]
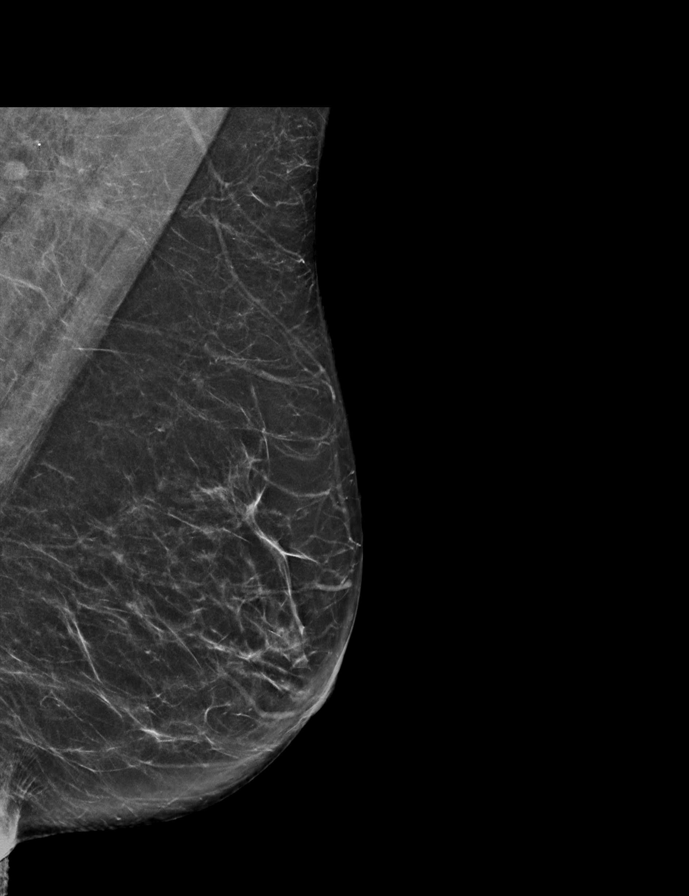

[L CC synth-2D]
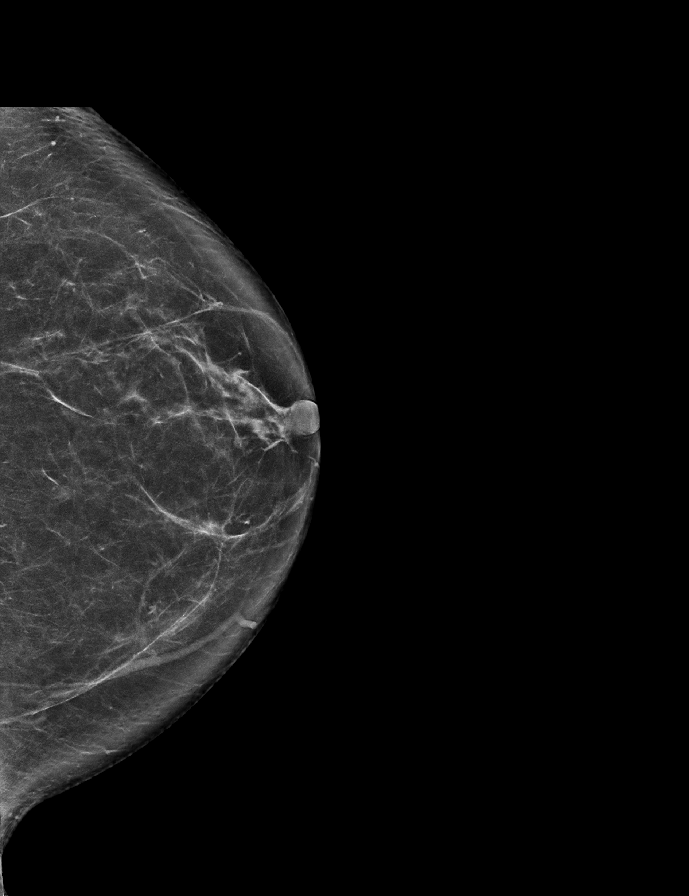

[R MLO synth-2D]
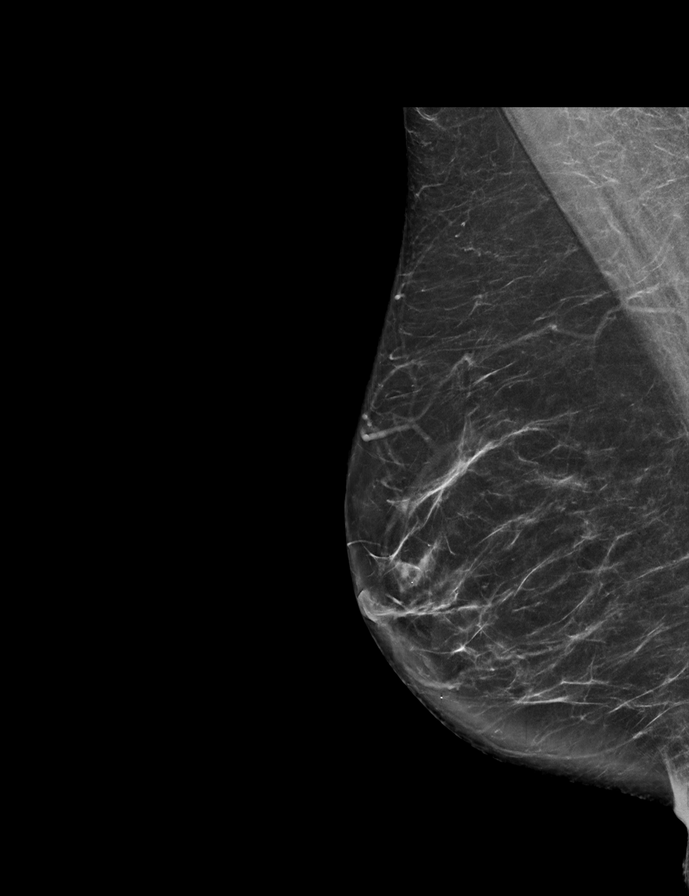

[R CC tomo · 2 of 70 frames shown]
[frame 23/70]
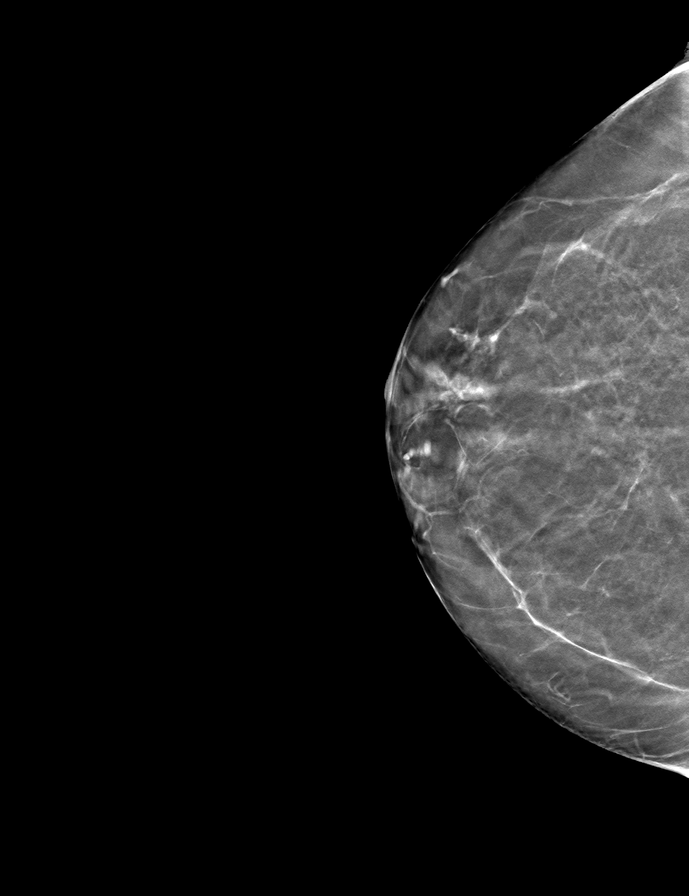
[frame 35/70]
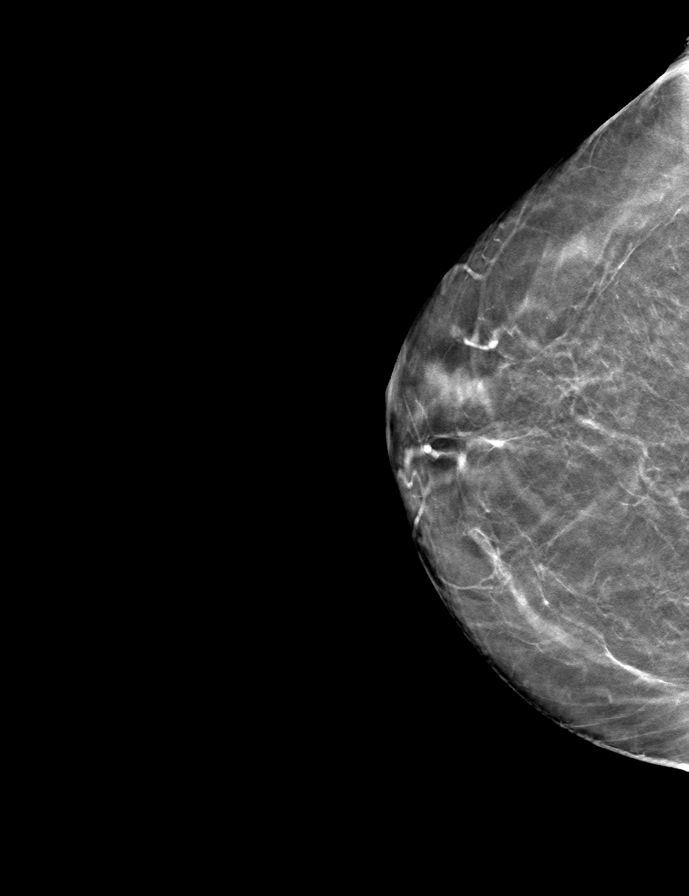

[L MLO tomo · tomo slice 37/72.0]
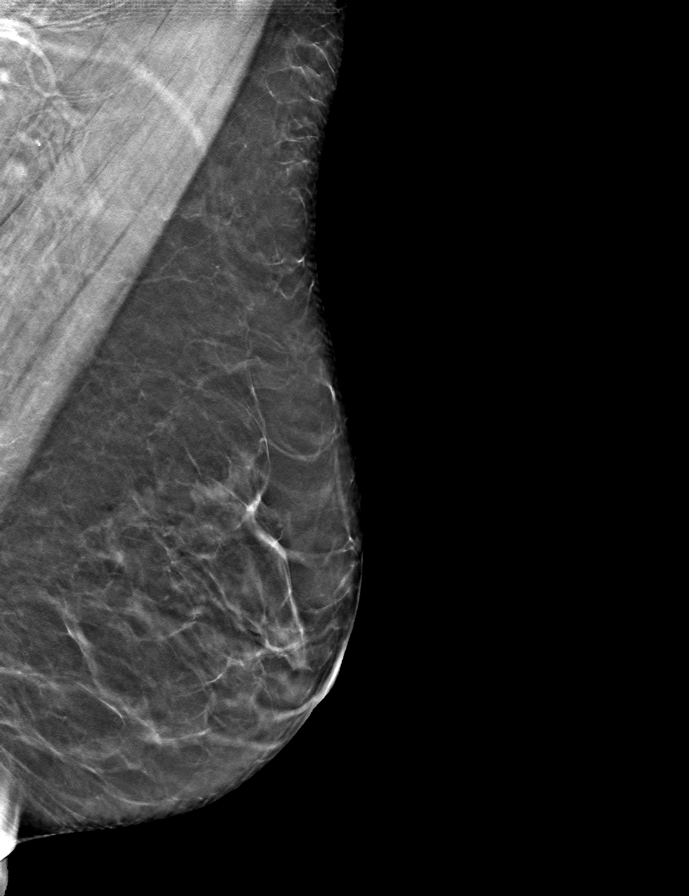

[L CC tomo · tomo slice 39/77.0]
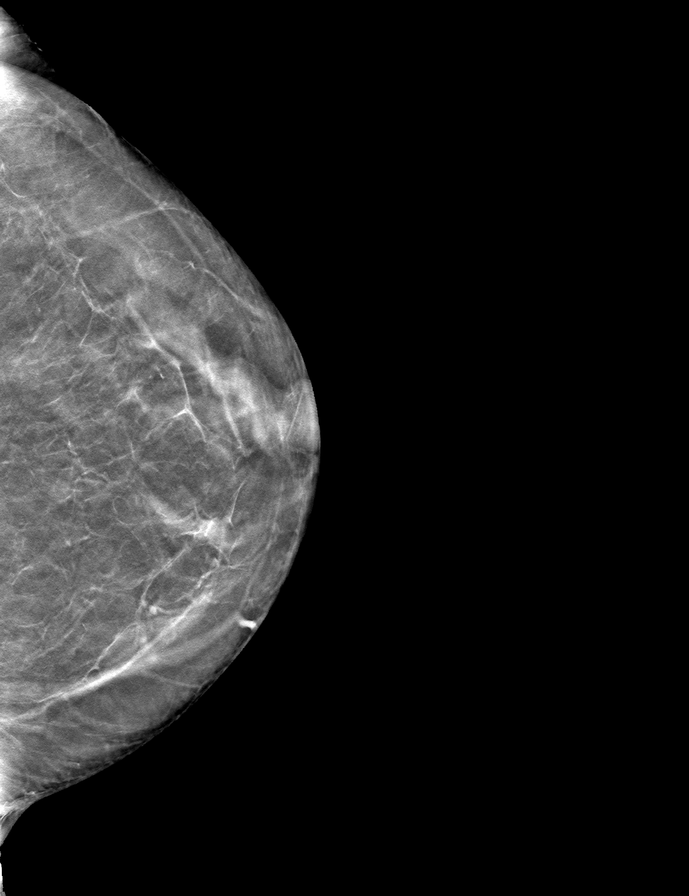

[R MLO tomo · tomo slice 39/77.0]
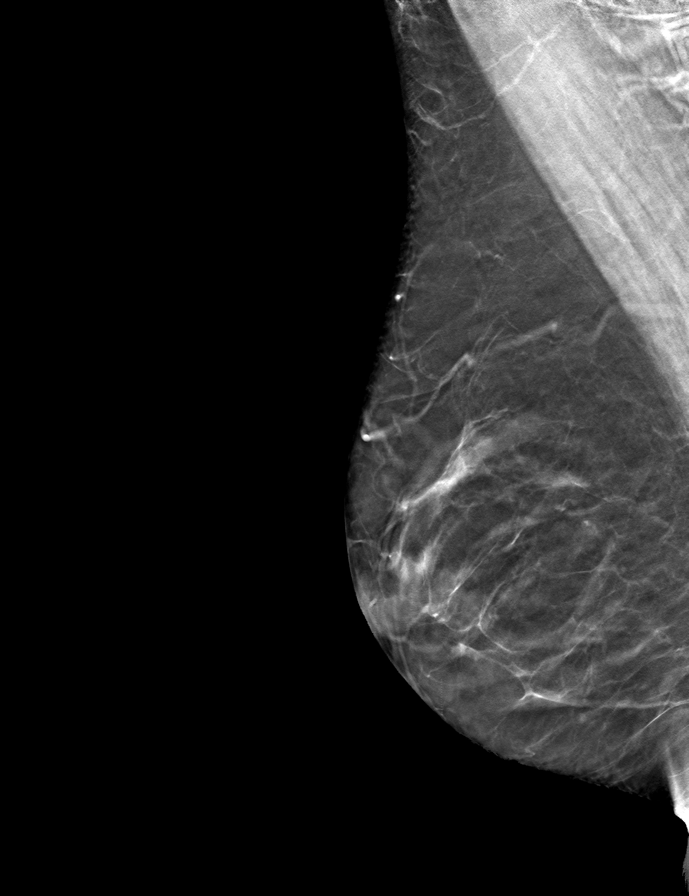

[9 of 24 positions shown; findings below may reference images not displayed]

ACR Breast Density Category b: There are scattered areas of
fibroglandular density.
FINDINGS: There are no findings suspicious for malignancy.
IMPRESSION: No mammographic evidence of malignancy. A result letter of this
screening mammogram will be mailed directly to the patient.

RECOMMENDATION:
Screening mammogram in one year. (Code:51-O-LD2)

BI-RADS CATEGORY  1: Negative.

## 2023-10-06 ENCOUNTER — Ambulatory Visit (INDEPENDENT_AMBULATORY_CARE_PROVIDER_SITE_OTHER): Admitting: Obstetrics and Gynecology

## 2023-10-06 ENCOUNTER — Encounter: Payer: Self-pay | Admitting: Obstetrics and Gynecology

## 2023-10-06 ENCOUNTER — Other Ambulatory Visit (HOSPITAL_COMMUNITY)
Admission: RE | Admit: 2023-10-06 | Discharge: 2023-10-06 | Disposition: A | Source: Ambulatory Visit | Attending: Obstetrics and Gynecology | Admitting: Obstetrics and Gynecology

## 2023-10-06 VITALS — BP 118/82 | HR 67 | Ht 68.0 in | Wt 167.0 lb

## 2023-10-06 DIAGNOSIS — Z124 Encounter for screening for malignant neoplasm of cervix: Secondary | ICD-10-CM

## 2023-10-06 DIAGNOSIS — R195 Other fecal abnormalities: Secondary | ICD-10-CM

## 2023-10-06 DIAGNOSIS — N951 Menopausal and female climacteric states: Secondary | ICD-10-CM

## 2023-10-06 DIAGNOSIS — Z9189 Other specified personal risk factors, not elsewhere classified: Secondary | ICD-10-CM

## 2023-10-06 DIAGNOSIS — Z01419 Encounter for gynecological examination (general) (routine) without abnormal findings: Secondary | ICD-10-CM

## 2023-10-06 DIAGNOSIS — M8580 Other specified disorders of bone density and structure, unspecified site: Secondary | ICD-10-CM | POA: Diagnosis not present

## 2023-10-06 DIAGNOSIS — Z780 Asymptomatic menopausal state: Secondary | ICD-10-CM

## 2023-10-06 NOTE — Progress Notes (Signed)
 72 y.o. G6P2002 Married Caucasian female here for a breast and pelvic exam.    The patient is also followed for osteopenia.  Having colon issues since having her last colonoscopy.  Has rectal burning.   Having frequent loose bowel movements. Having BMs twice a night. Has seen a lot of providers.   Tested negative for celiac, alphagal (she thinks). Did pelvic floor therapy at [facility] for fecal urgency.  Denies anxiety but would like her health issues to be resolved.   Drinks caffeine.   Hx endometrial polyp.  No vaginal bleeding.  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   PCP: Olene Ryder, MD   Patient's last menstrual period was 03/01/2002.           Sexually active: Yes.    The current method of family planning is post menopausal status.    Menopausal hormone therapy:  n/a  Exercising: Yes.    Pilates, strength training, walking  Smoker:  no  OB History     Gravida  2   Para  2   Term  2   Preterm      AB      Living  2      SAB      IAB      Ectopic      Multiple      Live Births              HEALTH MAINTENANCE: Last 2 paps: 12/28/19 neg, 09/16/17 neg  History of abnormal Pap or positive HPV:  no Mammogram:  08/31/23 Breast Density Cat B, BIRADS Cat 1 neg  Colonoscopy:  04/18/21 due every 3 years (Care [facility])  Bone Density:  06/24/20  Result  osteopenic spine and left hip.  FRAX 15.2%/1.3%   Immunization History  Administered Date(s) Administered   Influenza-Unspecified 01/11/2017   PFIZER(Purple Top)SARS-COV-2 Vaccination 02/12/2019, 03/05/2019, 10/19/2019   Pfizer Covid-19 Vaccine Bivalent Booster 38yrs & up 10/14/2020      reports that she has never smoked. She has never used smokeless tobacco. She reports current alcohol use of about 14.0 standard drinks of alcohol per week. She reports that she does not use drugs.  Past Medical History:  Diagnosis Date   Family history of adverse reaction to anesthesia  1976   [place] SEVERE REACTION    Past Surgical History:  Procedure Laterality Date   APPENDECTOMY     CESAREAN SECTION  1986, 1989   DILATATION & CURETTAGE/HYSTEROSCOPY WITH MYOSURE Bilateral 10/04/2017   Procedure: DILATATION & CURETTAGE/HYSTEROSCOPY WITH MYOSURE;  Surgeon: Cathlyn JAYSON Nikki Bobie FORBES, MD;  Location: [facility];  Service: Gynecology;  Laterality: Bilateral;    Current Outpatient Medications  Medication Sig Dispense Refill   Multiple Vitamin (MULTIVITAMIN) tablet Take 1 tablet by mouth daily.     No current facility-administered medications for this visit.    ALLERGIES: Patient has no known allergies.  Family History  Problem Relation Age of Onset   Stroke Mother    Depression Brother    Heart disease Brother    Breast cancer Maternal Aunt    Cancer Paternal Grandmother        ovarian cancer   Ovarian cancer Paternal Grandmother     Review of Systems  All other systems reviewed and are negative.   PHYSICAL EXAM:  BP 118/82 (BP Location: Left Arm, Patient Position: Sitting)   Pulse 67   Ht 5' 8 (1.727 m)   Wt 167 lb (75.8 kg)  LMP 03/01/2002   SpO2 97%   BMI 25.39 kg/m     General appearance: alert, cooperative and appears stated age Head: normocephalic, without obvious abnormality, atraumatic Neck: no adenopathy, supple, symmetrical, trachea midline and thyroid normal to inspection and palpation Lungs: clear to auscultation bilaterally Breasts: normal appearance, no masses or tenderness, No nipple retraction or dimpling, No nipple discharge or bleeding, No axillary adenopathy Heart: regular rate and rhythm Abdomen: soft, non-tender; no masses, no organomegaly Extremities: extremities normal, atraumatic, no cyanosis or edema Skin: skin color, texture, turgor normal. No rashes or lesions Lymph nodes: cervical, supraclavicular, and axillary nodes normal. Neurologic: grossly normal  Pelvic: External genitalia:  no lesions              No abnormal  inguinal nodes palpated.              Urethra:  normal appearing urethra with no masses, tenderness or lesions              Bartholins and Skenes: normal                 Vagina: normal appearing vagina with normal color and discharge, no lesions              Cervix: no lesions              Pap taken: yes Bimanual Exam:  Uterus:  normal size, contour, position, consistency, mobility, non-tender              Adnexa: no mass, fullness, tenderness              Rectal exam: yes.  Confirms.              Anus:  normal sphincter tone, no lesions  Chaperone was present for exam:  Kari HERO, CMA  ASSESSMENT: Encounter for breast and pelvic exam.  Other personal risk factors not specified.  Cervical cancer screening.  Chronic loose stools. Menopausal female.  Osteopenia.   Ovarian cancer.  Paternal grandmother.   PLAN: Mammogram screening discussed. Self breast awareness reviewed. Pap and HRV collected:  yes Guidelines for Calcium, Vitamin D , regular exercise program including cardiovascular and weight bearing exercise. Medication refills:  NA Dietary irritants of caffeine and ETOH reviewed as possible contributors to loose bowel movements.  Dexa at [facility].  She will call to schedule.  Follow up:  yearly and prn.    Additional counseling given.  yes. 30 min  total time was spent for this patient encounter, including preparation, face-to-face counseling with the patient, coordination of care, and documentation of the encounter in addition to doing the breast and pelvic exam.

## 2023-10-06 NOTE — Patient Instructions (Signed)

## 2023-10-07 ENCOUNTER — Ambulatory Visit: Payer: Self-pay | Admitting: Obstetrics and Gynecology

## 2023-10-07 LAB — CYTOLOGY - PAP
Comment: NEGATIVE
Diagnosis: NEGATIVE
High risk HPV: NEGATIVE

## 2024-01-26 ENCOUNTER — Other Ambulatory Visit

## 2024-03-15 ENCOUNTER — Other Ambulatory Visit

## 2024-10-11 ENCOUNTER — Encounter: Admitting: Obstetrics and Gynecology
